# Patient Record
Sex: Female | Born: 1995 | Race: Black or African American | Hispanic: No | Marital: Single | Smoking: Never smoker
Health system: Southern US, Community
[De-identification: ages and names within clinical notes are randomized; demographics above are authoritative.]

## PROBLEM LIST (undated history)

## (undated) ENCOUNTER — Emergency Department (HOSPITAL_COMMUNITY): Admission: EM | Payer: No Typology Code available for payment source | Source: Home / Self Care

## (undated) ENCOUNTER — Inpatient Hospital Stay (HOSPITAL_COMMUNITY): Payer: Self-pay

## (undated) DIAGNOSIS — I509 Heart failure, unspecified: Secondary | ICD-10-CM

## (undated) DIAGNOSIS — Z8759 Personal history of other complications of pregnancy, childbirth and the puerperium: Secondary | ICD-10-CM

## (undated) DIAGNOSIS — J45909 Unspecified asthma, uncomplicated: Secondary | ICD-10-CM

## (undated) HISTORY — DX: Personal history of other complications of pregnancy, childbirth and the puerperium: Z87.59

---
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
## 2016-01-02 ENCOUNTER — Emergency Department (HOSPITAL_COMMUNITY): Payer: No Typology Code available for payment source

## 2016-01-02 ENCOUNTER — Emergency Department (HOSPITAL_COMMUNITY)
Admission: EM | Admit: 2016-01-02 | Discharge: 2016-01-02 | Disposition: A | Discharge status: Home / Self Care | Payer: No Typology Code available for payment source | Attending: Emergency Medicine | Admitting: Emergency Medicine

## 2016-01-02 ENCOUNTER — Encounter (HOSPITAL_COMMUNITY): Payer: Self-pay | Admitting: Emergency Medicine

## 2016-01-02 DIAGNOSIS — S29002A Unspecified injury of muscle and tendon of back wall of thorax, initial encounter: Secondary | ICD-10-CM | POA: Insufficient documentation

## 2016-01-02 DIAGNOSIS — Z79899 Other long term (current) drug therapy: Secondary | ICD-10-CM | POA: Diagnosis not present

## 2016-01-02 DIAGNOSIS — Y998 Other external cause status: Secondary | ICD-10-CM | POA: Diagnosis not present

## 2016-01-02 DIAGNOSIS — S199XXA Unspecified injury of neck, initial encounter: Secondary | ICD-10-CM | POA: Diagnosis present

## 2016-01-02 DIAGNOSIS — R110 Nausea: Secondary | ICD-10-CM | POA: Insufficient documentation

## 2016-01-02 DIAGNOSIS — Y92410 Unspecified street and highway as the place of occurrence of the external cause: Secondary | ICD-10-CM | POA: Diagnosis not present

## 2016-01-02 DIAGNOSIS — S161XXA Strain of muscle, fascia and tendon at neck level, initial encounter: Secondary | ICD-10-CM | POA: Diagnosis not present

## 2016-01-02 DIAGNOSIS — J45909 Unspecified asthma, uncomplicated: Secondary | ICD-10-CM | POA: Insufficient documentation

## 2016-01-02 DIAGNOSIS — Y9389 Activity, other specified: Secondary | ICD-10-CM | POA: Diagnosis not present

## 2016-01-02 MED ORDER — IBUPROFEN 400 MG PO TABS
400.0000 mg | ORAL_TABLET | Freq: Once | ORAL | Status: AC
  Administered 2016-01-02: 400 mg via ORAL
  Filled 2016-01-02: qty 1

## 2016-01-02 MED ORDER — ONDANSETRON 4 MG PO TBDP
4.0000 mg | ORAL_TABLET | Freq: Once | ORAL | Status: AC
  Administered 2016-01-02: 4 mg via ORAL
  Filled 2016-01-02: qty 1

## 2016-01-02 MED ORDER — IBUPROFEN 600 MG PO TABS: 600.0000 mg | ORAL_TABLET | Freq: Four times a day (QID) | ORAL | Status: DC | PRN

## 2016-01-02 MED ORDER — CYCLOBENZAPRINE HCL 10 MG PO TABS
5.0000 mg | ORAL_TABLET | Freq: Once | ORAL | Status: AC
  Administered 2016-01-02: 5 mg via ORAL
  Filled 2016-01-02: qty 1

## 2016-01-02 MED ORDER — CYCLOBENZAPRINE HCL 5 MG PO TABS: 5.0000 mg | ORAL_TABLET | Freq: Three times a day (TID) | ORAL | Status: DC | PRN

## 2016-01-02 NOTE — Discharge Instructions (Signed)
Take motrin for pain.   Take flexeril for muscle spasms.   Expect some neck and back pain for several days.   See your doctor  Return to ER if you have severe pain, worse spasms, chest pain, abdominal pain

## 2016-01-02 NOTE — ED Notes (Signed)
Pt verbalized understanding of discharge instructions and follow up care
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
## 2016-05-05 ENCOUNTER — Emergency Department (HOSPITAL_COMMUNITY)
Admission: EM | Admit: 2016-05-05 | Discharge: 2016-05-05 | Disposition: A | Discharge status: Home / Self Care | Payer: No Typology Code available for payment source | Attending: Emergency Medicine | Admitting: Emergency Medicine

## 2016-05-05 ENCOUNTER — Emergency Department (HOSPITAL_COMMUNITY): Payer: No Typology Code available for payment source

## 2016-05-05 ENCOUNTER — Encounter (HOSPITAL_COMMUNITY): Payer: Self-pay | Admitting: Emergency Medicine

## 2016-05-05 DIAGNOSIS — M25521 Pain in right elbow: Secondary | ICD-10-CM | POA: Insufficient documentation

## 2016-05-05 DIAGNOSIS — Z5321 Procedure and treatment not carried out due to patient leaving prior to being seen by health care provider: Secondary | ICD-10-CM | POA: Diagnosis not present

## 2016-05-05 NOTE — ED Notes (Signed)
Called multiple times in waiting room and subwaiting and no response.

## 2016-05-05 NOTE — ED Notes (Signed)
Called for patient again, no response 
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
## 2016-05-06 NOTE — ED Provider Notes (Signed)
Patient not in room x 2.  Patient was not located within the department.  Patient apparently eloped prior to being seen by a provider.    Kathryn Mornavid Kingsly Kloepfer, NP 05/06/16 [phone]    Gwyneth SproutWhitney Plunkett, MD 05/06/16 2137

## 2016-07-31 NOTE — L&D Delivery Note (Signed)
Delivery Note At 8:21 AM a viable female was delivered preciptously by RN (with Dr. Jackelyn KnifeMeisinger on stand-by) via Vaginal, Spontaneous (Presentation:LOA). CNM in room just after delivery of baby. Cord double clamped after 1 minute, cut by MGM. APGAR: 9, 9; weight pending.   Placenta status: spontaneous, intact via Tomasa BlaseSchultz.  Cord: 3VC with no complications.  Cord pH: n/a  Anesthesia: none Episiotomy: None Lacerations: None Suture Repair: n/a Est. Blood Loss (mL): 50  Mom to postpartum.  Baby to Couplet care / Skin to Skin.  Kathryn Moraolitta Shamar Engelmann, MSN, CNM 07/22/2017, 8:56 AM

## 2016-09-22 ENCOUNTER — Inpatient Hospital Stay (HOSPITAL_COMMUNITY): Payer: Medicaid Other | Admitting: Anesthesiology

## 2016-09-22 ENCOUNTER — Encounter (HOSPITAL_COMMUNITY): Payer: Self-pay

## 2016-09-22 ENCOUNTER — Inpatient Hospital Stay (HOSPITAL_COMMUNITY): Payer: Medicaid Other

## 2016-09-22 ENCOUNTER — Ambulatory Visit (HOSPITAL_COMMUNITY)
Admission: AD | Admit: 2016-09-22 | Discharge: 2016-09-22 | Disposition: A | Discharge status: Home / Self Care | Payer: Medicaid Other | Source: Ambulatory Visit | Attending: Obstetrics and Gynecology | Admitting: Obstetrics and Gynecology

## 2016-09-22 ENCOUNTER — Encounter (HOSPITAL_COMMUNITY)
Admission: AD | Disposition: A | Discharge status: Home / Self Care | Payer: Self-pay | Source: Ambulatory Visit | Attending: Obstetrics and Gynecology

## 2016-09-22 DIAGNOSIS — D649 Anemia, unspecified: Secondary | ICD-10-CM | POA: Diagnosis not present

## 2016-09-22 DIAGNOSIS — O209 Hemorrhage in early pregnancy, unspecified: Secondary | ICD-10-CM

## 2016-09-22 DIAGNOSIS — O26899 Other specified pregnancy related conditions, unspecified trimester: Secondary | ICD-10-CM

## 2016-09-22 DIAGNOSIS — R109 Unspecified abdominal pain: Secondary | ICD-10-CM

## 2016-09-22 DIAGNOSIS — O034 Incomplete spontaneous abortion without complication: Secondary | ICD-10-CM | POA: Insufficient documentation

## 2016-09-22 DIAGNOSIS — O039 Complete or unspecified spontaneous abortion without complication: Secondary | ICD-10-CM

## 2016-09-22 HISTORY — PX: DILATION AND EVACUATION: SHX1459

## 2016-09-22 LAB — URINALYSIS, ROUTINE W REFLEX MICROSCOPIC
BACTERIA UA: NONE SEEN
Bilirubin Urine: NEGATIVE
Glucose, UA: 50 mg/dL — AB
KETONES UR: NEGATIVE mg/dL
Nitrite: NEGATIVE
PROTEIN: 100 mg/dL — AB
Specific Gravity, Urine: 1.024 (ref 1.005–1.030)
pH: 6 (ref 5.0–8.0)

## 2016-09-22 LAB — CBC
HCT: 32.6 % — ABNORMAL LOW (ref 36.0–46.0)
HCT: 31.1 % — ABNORMAL LOW (ref 36.0–46.0)
HEMOGLOBIN: 10.8 g/dL — AB (ref 12.0–15.0)
Hemoglobin: 11.7 g/dL — ABNORMAL LOW (ref 12.0–15.0)
MCH: 29.6 pg (ref 26.0–34.0)
MCH: 29.1 pg (ref 26.0–34.0)
MCHC: 35.9 g/dL (ref 30.0–36.0)
MCHC: 34.7 g/dL (ref 30.0–36.0)
MCV: 82.5 fL (ref 78.0–100.0)
MCV: 83.8 fL (ref 78.0–100.0)
PLATELETS: 218 10*3/uL (ref 150–400)
Platelets: 216 K/uL (ref 150–400)
RBC: 3.95 MIL/uL (ref 3.87–5.11)
RBC: 3.71 MIL/uL — ABNORMAL LOW (ref 3.87–5.11)
RDW: 13 % (ref 11.5–15.5)
RDW: 13 % (ref 11.5–15.5)
WBC: 6.7 K/uL (ref 4.0–10.5)
WBC: 9.5 10*3/uL (ref 4.0–10.5)

## 2016-09-22 LAB — RAPID URINE DRUG SCREEN, HOSP PERFORMED
AMPHETAMINES: NOT DETECTED
BENZODIAZEPINES: NOT DETECTED
Barbiturates: NOT DETECTED
Cocaine: NOT DETECTED
Opiates: NOT DETECTED
TETRAHYDROCANNABINOL: NOT DETECTED

## 2016-09-22 LAB — COMPREHENSIVE METABOLIC PANEL
ALT: 10 U/L — ABNORMAL LOW (ref 14–54)
ANION GAP: 10 (ref 5–15)
AST: 16 U/L (ref 15–41)
Albumin: 3.8 g/dL (ref 3.5–5.0)
Alkaline Phosphatase: 51 U/L (ref 38–126)
BILIRUBIN TOTAL: 0.5 mg/dL (ref 0.3–1.2)
BUN: >5 mg/dL — ABNORMAL LOW (ref 6–20)
CHLORIDE: 104 mmol/L (ref 101–111)
CO2: 22 mmol/L (ref 22–32)
Calcium: 9 mg/dL (ref 8.9–10.3)
Creatinine, Ser: 0.64 mg/dL (ref 0.44–1.00)
GFR calc Af Amer: >60 mL/min (ref >60)
GFR calc non Af Amer: >60 mL/min (ref >60)
GLUCOSE: 132 mg/dL — AB (ref 65–99)
POTASSIUM: 3.3 mmol/L — AB (ref 3.5–5.1)
SODIUM: 136 mmol/L (ref 135–145)
TOTAL PROTEIN: 7.7 g/dL (ref 6.5–8.1)

## 2016-09-22 LAB — WET PREP, GENITAL
Clue Cells Wet Prep HPF POC: NONE SEEN
Sperm: NONE SEEN
Trich, Wet Prep: NONE SEEN
Yeast Wet Prep HPF POC: NONE SEEN

## 2016-09-22 LAB — KLEIHAUER-BETKE STAIN
# Vials RhIg: 1
FETAL CELLS %: 0 %
Quantitation Fetal Hemoglobin: 0 mL

## 2016-09-22 LAB — TSH: TSH: 1.652 u[IU]/mL (ref 0.350–4.500)

## 2016-09-22 LAB — POCT PREGNANCY, URINE: Preg Test, Ur: POSITIVE — AB

## 2016-09-22 LAB — ABO/RH: ABO/RH(D): O POS

## 2016-09-22 LAB — HCG, QUANTITATIVE, PREGNANCY: hCG, Beta Chain, Quant, S: 1184 m[IU]/mL — ABNORMAL HIGH (ref <5)

## 2016-09-22 SURGERY — DILATION AND EVACUATION, UTERUS
Anesthesia: Monitor Anesthesia Care | Site: Vagina

## 2016-09-22 MED ORDER — FERROUS GLUCONATE 324 (38 FE) MG PO TABS: 324.0000 mg | ORAL_TABLET | Freq: Two times a day (BID) | ORAL | 0 refills | Status: DC

## 2016-09-22 MED ORDER — LIDOCAINE HCL (CARDIAC) 20 MG/ML IV SOLN
INTRAVENOUS | Status: AC
  Filled 2016-09-22: qty 5

## 2016-09-22 MED ORDER — FAMOTIDINE IN NACL 20-0.9 MG/50ML-% IV SOLN
20.0000 mg | Freq: Once | INTRAVENOUS | Status: AC
  Administered 2016-09-22: 20 mg via INTRAVENOUS
  Filled 2016-09-22: qty 50

## 2016-09-22 MED ORDER — DEXAMETHASONE SODIUM PHOSPHATE 10 MG/ML IJ SOLN
INTRAMUSCULAR | Status: DC | PRN
  Administered 2016-09-22: 10 mg via INTRAVENOUS

## 2016-09-22 MED ORDER — PROPOFOL 10 MG/ML IV BOLUS
INTRAVENOUS | Status: AC
Start: 2016-09-22 — End: 2016-09-22
  Filled 2016-09-22: qty 20

## 2016-09-22 MED ORDER — HYDROMORPHONE HCL 1 MG/ML IJ SOLN
1.0000 mg | Freq: Once | INTRAMUSCULAR | Status: AC
Start: 2016-09-22 — End: 2016-09-22
  Administered 2016-09-22: 1 mg via INTRAMUSCULAR
  Filled 2016-09-22: qty 1

## 2016-09-22 MED ORDER — OXYCODONE-ACETAMINOPHEN 5-325 MG PO TABS: 1.0000 | ORAL_TABLET | Freq: Four times a day (QID) | ORAL | 0 refills | Status: DC | PRN

## 2016-09-22 MED ORDER — MIDAZOLAM HCL 2 MG/2ML IJ SOLN
INTRAMUSCULAR | Status: AC
  Filled 2016-09-22: qty 2

## 2016-09-22 MED ORDER — MIDAZOLAM HCL 5 MG/5ML IJ SOLN
INTRAMUSCULAR | Status: DC | PRN
  Administered 2016-09-22: 2 mg via INTRAVENOUS

## 2016-09-22 MED ORDER — PROMETHAZINE HCL 25 MG/ML IJ SOLN: 6.2500 mg | INTRAMUSCULAR | Status: DC | PRN

## 2016-09-22 MED ORDER — METHYLERGONOVINE MALEATE 0.2 MG/ML IJ SOLN
INTRAMUSCULAR | Status: AC
  Filled 2016-09-22: qty 1

## 2016-09-22 MED ORDER — DEXAMETHASONE SODIUM PHOSPHATE 10 MG/ML IJ SOLN
INTRAMUSCULAR | Status: AC
  Filled 2016-09-22: qty 1

## 2016-09-22 MED ORDER — HYDROMORPHONE HCL 1 MG/ML IJ SOLN
1.0000 mg | Freq: Once | INTRAMUSCULAR | Status: AC
  Administered 2016-09-22: 1 mg via INTRAVENOUS
  Filled 2016-09-22: qty 1

## 2016-09-22 MED ORDER — LACTATED RINGERS IV SOLN
INTRAVENOUS | Status: DC | PRN
  Administered 2016-09-22: 12:00:00 via INTRAVENOUS

## 2016-09-22 MED ORDER — LIDOCAINE 2% (20 MG/ML) 5 ML SYRINGE
INTRAMUSCULAR | Status: DC | PRN
  Administered 2016-09-22: 50 mg via INTRAVENOUS

## 2016-09-22 MED ORDER — 0.9 % SODIUM CHLORIDE (POUR BTL) OPTIME
TOPICAL | Status: DC | PRN
  Administered 2016-09-22: 1000 mL

## 2016-09-22 MED ORDER — FENTANYL CITRATE (PF) 100 MCG/2ML IJ SOLN: 25.0000 ug | INTRAMUSCULAR | Status: DC | PRN

## 2016-09-22 MED ORDER — SODIUM CHLORIDE 0.9 % IV SOLN
INTRAVENOUS | Status: DC | PRN
  Administered 2016-09-22: 12:00:00 via INTRAVENOUS

## 2016-09-22 MED ORDER — IBUPROFEN 600 MG PO TABS: 600.0000 mg | ORAL_TABLET | Freq: Four times a day (QID) | ORAL | 0 refills | Status: DC | PRN

## 2016-09-22 MED ORDER — ROCURONIUM BROMIDE 100 MG/10ML IV SOLN
INTRAVENOUS | Status: AC
  Filled 2016-09-22: qty 1

## 2016-09-22 MED ORDER — METHYLERGONOVINE MALEATE 0.2 MG/ML IJ SOLN
0.2000 mg | Freq: Once | INTRAMUSCULAR | Status: AC
  Administered 2016-09-22: 0.2 mg via INTRAMUSCULAR

## 2016-09-22 MED ORDER — POTASSIUM CHLORIDE IN NACL 40-0.9 MEQ/L-% IV SOLN
INTRAVENOUS | Status: DC
  Filled 2016-09-22: qty 1000

## 2016-09-22 MED ORDER — FENTANYL CITRATE (PF) 100 MCG/2ML IJ SOLN
INTRAMUSCULAR | Status: DC | PRN
  Administered 2016-09-22 (×2): 50 ug via INTRAVENOUS

## 2016-09-22 MED ORDER — ONDANSETRON HCL 4 MG/2ML IJ SOLN
INTRAMUSCULAR | Status: AC
  Filled 2016-09-22: qty 2

## 2016-09-22 MED ORDER — DOCUSATE SODIUM 100 MG PO CAPS: 100.0000 mg | ORAL_CAPSULE | Freq: Two times a day (BID) | ORAL | 0 refills | Status: DC

## 2016-09-22 MED ORDER — FENTANYL CITRATE (PF) 100 MCG/2ML IJ SOLN
INTRAMUSCULAR | Status: AC
  Filled 2016-09-22: qty 2

## 2016-09-22 MED ORDER — ONDANSETRON HCL 4 MG/2ML IJ SOLN
INTRAMUSCULAR | Status: DC | PRN
  Administered 2016-09-22: 4 mg via INTRAVENOUS

## 2016-09-22 MED ORDER — SOD CITRATE-CITRIC ACID 500-334 MG/5ML PO SOLN
30.0000 mL | Freq: Once | ORAL | Status: AC
  Administered 2016-09-22: 30 mL via ORAL
  Filled 2016-09-22: qty 15

## 2016-09-22 MED ORDER — DOXYCYCLINE HYCLATE 100 MG IV SOLR
100.0000 mg | Freq: Once | INTRAVENOUS | Status: AC
  Administered 2016-09-22: 100 mg via INTRAVENOUS
  Filled 2016-09-22: qty 100

## 2016-09-22 MED ORDER — PROPOFOL 10 MG/ML IV BOLUS
INTRAVENOUS | Status: DC | PRN
  Administered 2016-09-22: 30 mg via INTRAVENOUS
  Administered 2016-09-22: 20 mg via INTRAVENOUS
  Administered 2016-09-22: 30 mg via INTRAVENOUS
  Administered 2016-09-22 (×3): 20 mg via INTRAVENOUS
  Administered 2016-09-22: 30 mg via INTRAVENOUS
  Administered 2016-09-22: 20 mg via INTRAVENOUS

## 2016-09-22 SURGICAL SUPPLY — 19 items
CATH ROBINSON RED A/P 16FR (CATHETERS) ×3 IMPLANT
CLOTH BEACON ORANGE TIMEOUT ST (SAFETY) ×3 IMPLANT
DECANTER SPIKE VIAL GLASS SM (MISCELLANEOUS) ×3 IMPLANT
GLOVE BIO SURGEON STRL SZ7 (GLOVE) ×3 IMPLANT
GLOVE BIOGEL PI IND STRL 7.0 (GLOVE) ×1 IMPLANT
GLOVE BIOGEL PI INDICATOR 7.0 (GLOVE) ×2
GOWN STRL REUS W/TWL LRG LVL3 (GOWN DISPOSABLE) ×6 IMPLANT
KIT BERKELEY 1ST TRIMESTER 3/8 (MISCELLANEOUS) ×3 IMPLANT
NS IRRIG 1000ML POUR BTL (IV SOLUTION) ×3 IMPLANT
PACK VAGINAL MINOR WOMEN LF (CUSTOM PROCEDURE TRAY) ×3 IMPLANT
PAD OB MATERNITY 4.3X12.25 (PERSONAL CARE ITEMS) ×3 IMPLANT
PAD PREP 24X48 CUFFED NSTRL (MISCELLANEOUS) ×3 IMPLANT
SET BERKELEY SUCTION TUBING (SUCTIONS) ×3 IMPLANT
TOWEL OR 17X24 6PK STRL BLUE (TOWEL DISPOSABLE) ×6 IMPLANT
TOWEL OR 17X26 4PK STRL BLUE (TOWEL DISPOSABLE) ×3 IMPLANT
VACURETTE 10 RIGID CVD (CANNULA) ×3 IMPLANT
VACURETTE 7MM CVD STRL WRAP (CANNULA) IMPLANT
VACURETTE 8 RIGID CVD (CANNULA) ×1 IMPLANT
VACURETTE 9 RIGID CVD (CANNULA) IMPLANT

## 2016-09-22 NOTE — MAU Note (Signed)
Pt states that she started her period on Sunday-last night began having sharp abdominal pain-rates 10/10. Took 600mg  ibuprofen at 0400-has not helped. States this pain is worse than her normal periods.

## 2016-09-22 NOTE — Anesthesia Preprocedure Evaluation (Addendum)
Anesthesia Evaluation  Patient identified by MRN, date of birth, ID band Patient awake    Reviewed: Allergy & Precautions, NPO status , Patient's Chart, lab work & pertinent test results  Airway Mallampati: II  TM Distance: >3 FB Neck ROM: Full    Dental  (+) Teeth Intact, Dental Advisory Given   Pulmonary asthma ,    Pulmonary exam normal breath sounds clear to auscultation       Cardiovascular Exercise Tolerance: Good negative cardio ROS Normal cardiovascular exam Rhythm:Regular Rate:Normal     Neuro/Psych negative neurological ROS  negative psych ROS   GI/Hepatic negative GI ROS, Neg liver ROS,   Endo/Other  negative endocrine ROS  Renal/GU negative Renal ROS     Musculoskeletal negative musculoskeletal ROS (+)   Abdominal   Peds  Hematology  (+) Blood dyscrasia, anemia ,   Anesthesia Other Findings Day of surgery medications reviewed with the patient.  Reproductive/Obstetrics (+) Pregnancy Fetal demise                             Anesthesia Physical Anesthesia Plan  ASA: II  Anesthesia Plan: MAC   Post-op Pain Management:    Induction: Intravenous  Airway Management Planned: Simple Face Mask  Additional Equipment:   Intra-op Plan:   Post-operative Plan:   Informed Consent: I have reviewed the patients History and Physical, chart, labs and discussed the procedure including the risks, benefits and alternatives for the proposed anesthesia with the patient or authorized representative who has indicated his/her understanding and acceptance.   Dental advisory given  Plan Discussed with: CRNA  Anesthesia Plan Comments: (Discussed risks/benefits/alternatives to MAC sedation including need for ventilatory support, hypotension, need for conversion to general anesthesia.  All patient questions answered.  Patient/guardian wishes to proceed.)       Anesthesia Quick  Evaluation

## 2016-09-22 NOTE — MAU Provider Note (Signed)
History     CSN: [id number]  Arrival date and time: 09/22/16 [id number]   First Provider Initiated Contact with Patient 09/22/16 [phone]      Chief Complaint  Patient presents with  . Vaginal Bleeding   Vaginal Bleeding  The patient's primary symptoms include pelvic pain and vaginal bleeding. The patient's pertinent negatives include no vaginal discharge. This is a new problem. Episode onset: 09/17/16  The problem occurs intermittently. The problem has been unchanged. Pain severity now: 9/10  The problem affects both sides. She is pregnant. Pertinent negatives include no chills, constipation, diarrhea, dysuria, fever, frequency, nausea, urgency or vomiting. The vaginal discharge was bloody. The vaginal bleeding is typical of menses. She has been passing clots (about the size of a pea. ). She has not been passing tissue. Nothing aggravates the symptoms. She has tried NSAIDs for the symptoms. The treatment provided no relief. Sexual activity: last intercourse about on month ago. She uses nothing for contraception. Her menstrual history has been regular (10/somtime/2017 ).   Past Medical History:  Diagnosis Date  . Asthma   . Medical history non-contributory     Past Surgical History:  Procedure Laterality Date  . NO PAST SURGERIES      History reviewed. No pertinent family history.  Social History  Substance Use Topics  . Smoking status: Never Smoker  . Smokeless tobacco: Never Used  . Alcohol use No    Allergies: No Known Allergies  Prescriptions Prior to Admission  Medication Sig Dispense Refill Last Dose  . cyclobenzaprine (FLEXERIL) 5 MG tablet Take 1 tablet (5 mg total) by mouth 3 (three) times daily as needed for muscle spasms. 10 tablet 0   . famotidine (PEPCID) 20 MG tablet Take 1 tablet (20 mg total) by mouth 2 (two) times daily. 14 tablet 0 Past Month at Unknown time  . ibuprofen (ADVIL,MOTRIN) 600 MG tablet Take 1 tablet (600 mg total) by mouth every 6 (six) hours as needed.  30 tablet 0   . ondansetron (ZOFRAN ODT) 4 MG disintegrating tablet Take 1 tablet (4 mg total) by mouth every 8 (eight) hours as needed for nausea or vomiting. 9 tablet 0 Past Month at Unknown time    Review of Systems  Constitutional: Negative for chills and fever.  Gastrointestinal: Negative for constipation, diarrhea, nausea and vomiting.  Genitourinary: Positive for pelvic pain and vaginal bleeding. Negative for dysuria, frequency, urgency and vaginal discharge.   Physical Exam   Blood pressure 142/79, pulse (!) 134, temperature 99.4 F (37.4 C), temperature source Oral, resp. rate 20, height 4\' 11"  (1.499 m), weight 133 lb (60.3 kg), last menstrual period 09/17/2016, SpO2 100 %.  Physical Exam  Nursing note and vitals reviewed. Constitutional: She is oriented to person, place, and time. She appears well-developed and well-nourished. No distress.  HENT:  Head: Normocephalic.  Cardiovascular: Normal rate.   Respiratory: Effort normal.  GI: Soft. There is no tenderness. There is no rebound.  Genitourinary:  Genitourinary Comments:  External: no lesion Vagina: small amount of blood seen  Cervix: pink, smooth, closed/thick Uterus: uterus about 13-14 week size   Neurological: She is alert and oriented to person, place, and time.  Skin: Skin is warm and dry.  Psychiatric: She has a normal mood and affect.   Results for orders placed or performed during the hospital encounter of 09/22/16 (from the past 24 hour(s))  Urinalysis, Routine w reflex microscopic     Status: Abnormal   Collection Time: 09/22/16  5:53 AM  Result Value Ref Range   Color, Urine YELLOW YELLOW   APPearance CLOUDY (A) CLEAR   Specific Gravity, Urine 1.024 1.005 - 1.030   pH 6.0 5.0 - 8.0   Glucose, UA 50 (A) NEGATIVE mg/dL   Hgb urine dipstick LARGE (A) NEGATIVE   Bilirubin Urine NEGATIVE NEGATIVE   Ketones, ur NEGATIVE NEGATIVE mg/dL   Protein, ur 161 (A) NEGATIVE mg/dL   Nitrite NEGATIVE NEGATIVE    Leukocytes, UA MODERATE (A) NEGATIVE   RBC / HPF TOO NUMEROUS TO COUNT 0 - 5 RBC/hpf   WBC, UA TOO NUMEROUS TO COUNT 0 - 5 WBC/hpf   Bacteria, UA NONE SEEN NONE SEEN   Squamous Epithelial / LPF 0-5 (A) NONE SEEN   WBC Clumps PRESENT    Mucous PRESENT   Pregnancy, urine POC     Status: Abnormal   Collection Time: 09/22/16  6:06 AM  Result Value Ref Range   Preg Test, Ur POSITIVE (A) NEGATIVE  Wet prep, genital     Status: Abnormal   Collection Time: 09/22/16  6:36 AM  Result Value Ref Range   Yeast Wet Prep HPF POC NONE SEEN NONE SEEN   Trich, Wet Prep NONE SEEN NONE SEEN   Clue Cells Wet Prep HPF POC NONE SEEN NONE SEEN   WBC, Wet Prep HPF POC FEW (A) NONE SEEN   Sperm NONE SEEN   CBC     Status: Abnormal   Collection Time: 09/22/16  6:41 AM  Result Value Ref Range   WBC 6.7 4.0 - 10.5 K/uL   RBC 3.95 3.87 - 5.11 MIL/uL   Hemoglobin 11.7 (L) 12.0 - 15.0 g/dL   HCT 09.6 (L) 04.5 - 40.9 %   MCV 82.5 78.0 - 100.0 fL   MCH 29.6 26.0 - 34.0 pg   MCHC 35.9 30.0 - 36.0 g/dL   RDW 81.1 91.4 - 78.2 %   Platelets 216 150 - 400 K/uL  ABO/Rh     Status: None (Preliminary result)   Collection Time: 09/22/16  6:41 AM  Result Value Ref Range   ABO/RH(D) O POS   hCG, quantitative, pregnancy     Status: Abnormal   Collection Time: 09/22/16  6:41 AM  Result Value Ref Range   hCG, Beta Chain, Quant, S 1,184 (H) <5 mIU/mL  Comprehensive metabolic panel     Status: Abnormal (Preliminary result)   Collection Time: 09/22/16  6:41 AM  Result Value Ref Range   Sodium 136 135 - 145 mmol/L   Potassium 3.3 (L) 3.5 - 5.1 mmol/L   Chloride 104 101 - 111 mmol/L   CO2 22 22 - 32 mmol/L   Glucose, Bld 132 (H) 65 - 99 mg/dL   BUN PENDING 6 - 20 mg/dL   Creatinine, Ser 9.56 0.44 - 1.00 mg/dL   Calcium 9.0 8.9 - 21.3 mg/dL   Total Protein 7.7 6.5 - 8.1 g/dL   Albumin 3.8 3.5 - 5.0 g/dL   AST 16 15 - 41 U/L   ALT 10 (L) 14 - 54 U/L   Alkaline Phosphatase 51 38 - 126 U/L   Total Bilirubin 0.5 0.3 -  1.2 mg/dL   GFR calc non Af Amer >60 >60 mL/min   GFR calc Af Amer >60 >60 mL/min   Anion gap 10 5 - 15   US Ob Comp Less 14 Wks  Result Date: 09/22/2016 CLINICAL DATA:  Six day history of abdominal/pelvic pain EXAM: OBSTETRIC <14 WK [place] AND TRANSVAGINAL OB US  TECHNIQUE: Both transabdominal and transvaginal ultrasound examinations were performed for complete evaluation of the gestation as well as the maternal uterus, adnexal regions, and pelvic cul-de-sac. Transvaginal technique was performed to assess early pregnancy. COMPARISON:  None. FINDINGS: Intrauterine gestational sac: Visualized Yolk sac:  Not visualized Embryo:  Visualized Cardiac Activity: Not visualized MSD: 36 mm Note that fetal cranium cannot be visualized. Subchorionic hemorrhage:  None visualized. Maternal uterus/adnexae: Cervical os closed. No extrauterine pelvic or adnexal mass. No free pelvic fluid. IMPRESSION: Findings meet definitive criteria for failed pregnancy. This follows SRU consensus guidelines: Diagnostic Criteria for Nonviable Pregnancy Early in the First Trimester. Macy Mis J Med [phone]. Note that fetal cranial tissue cannot be discerned on this study. Electronically Signed   By: Bretta Bang III M.D.   On: 09/22/2016 07:36   US Ob Transvaginal  Result Date: 09/22/2016 CLINICAL DATA:  Six day history of abdominal/pelvic pain EXAM: OBSTETRIC <14 WK [place] AND TRANSVAGINAL OB US TECHNIQUE: Both transabdominal and transvaginal ultrasound examinations were performed for complete evaluation of the gestation as well as the maternal uterus, adnexal regions, and pelvic cul-de-sac. Transvaginal technique was performed to assess early pregnancy. COMPARISON:  None. FINDINGS: Intrauterine gestational sac: Visualized Yolk sac:  Not visualized Embryo:  Visualized Cardiac Activity: Not visualized MSD: 36 mm Note that fetal cranium cannot be visualized. Subchorionic hemorrhage:  None visualized. Maternal uterus/adnexae: Cervical os  closed. No extrauterine pelvic or adnexal mass. No free pelvic fluid. IMPRESSION: Findings meet definitive criteria for failed pregnancy. This follows SRU consensus guidelines: Diagnostic Criteria for Nonviable Pregnancy Early in the First Trimester. Macy Mis J Med [phone]. Note that fetal cranial tissue cannot be discerned on this study. Electronically Signed   By: Bretta Bang III M.D.   On: 09/22/2016 07:36    MAU Course  Procedures  MDM 0744: D/W Dr. Alysia Penna, they are in sign out right now, but Dr. Vergie Living will come evaluate the patient for possible D&C. Patient has had 1mg  Dilaudid for pain. She reports that her pain is 6/10 now.   0800 Care turned over to Dr. Vergie Living.  Tawnya Crook  7:59 AM 09/22/16   Assessment and Plan

## 2016-09-22 NOTE — Op Note (Addendum)
Operative Note   09/22/2016  PRE-OP DIAGNOSIS: ?retained products of conception after spontaneous abortion   POST-OP DIAGNOSIS: retained POCs  SURGEON: Surgeon(s) and Role:    * Marion Bingharlie Gorgeous Newlun, MD - Primary  ASSISTANT: None  PROCEDURE:  Suction dilation and curettage  ANESTHESIA: Monitor Anesthesia Care  ESTIMATED BLOOD LOSS: 25mL  DRAINS: I/O catheter 80mL UOP  TOTAL IV FLUIDS: per anesthesia note  SPECIMENS: products of conception to pathology  VTE PROPHYLAXIS: SCDs to the bilateral lower extremities  ANTIBIOTICS: Doxycycline 100mg  IV x 1 pre op  COMPLICATIONS: none  DISPOSITION: PACU - hemodynamically stable.  CONDITION: stable  BLOOD TYPE: O POS. Rhogam given:not applicable  FINDINGS: Normal appearing products of conception was seen on suction at the fundus. gritty texture in all four quadrants at the end of the case  PROCEDURE IN DETAIL:  After informed consent was obtained, the patient was taken to the operating room where anesthesia was obtained without difficulty. The patient was positioned in the dorsal lithotomy position in [place] stirrups. The patient was examined under anesthesia, with the above noted findings.  The bi-valved speculum was placed inside the patient's vagina, and the the anterior lip of the cervix was seen and grasped with the tenaculum.  The cervix easily passed a #10 suction cannula.  The suction was then calibrated to 60mmHg and connected to the cannula, which was then introduced with the above noted findings. A gentle curettage was done at the end and yield no products of conception.   The suction was then done one more time to remove any remaining curettage material.  Excellent hemostasis was noted, and all instruments were removed, with excellent hemostasis noted throughout.  She was then taken out of dorsal lithotomy. The patient tolerated the procedure well.  Sponge, lap and instrument counts were correct x2.  The patient was taken to  recovery room in excellent condition.  Methergine 0.2mg  IM x 1 was given at the end of the case for bleeding prophylaxis.   Cornelia Copaharlie Zana Biancardi, Jr MD Attending [facility](Faculty Practice)

## 2016-09-22 NOTE — Anesthesia Postprocedure Evaluation (Signed)
Anesthesia Post Note  Patient: Kathryn Silva  Procedure(s) Performed: Procedure(s) (LRB): SUCTION DILATATION AND EVACUATION (N/A)  Patient location during evaluation: PACU Anesthesia Type: MAC Level of consciousness: awake and alert Pain management: pain level controlled Vital Signs Assessment: post-procedure vital signs reviewed and stable Respiratory status: spontaneous breathing, nonlabored ventilation, respiratory function stable and patient connected to nasal cannula oxygen Cardiovascular status: blood pressure returned to baseline and stable Postop Assessment: no signs of nausea or vomiting Anesthetic complications: no        Last Vitals:  Vitals:   09/22/16 1345 09/22/16 1437  BP: 113/77 119/65  Pulse: (!) 117 (!) 109  Resp: 19 20  Temp:  36.9 C    Last Pain:  Vitals:   09/22/16 1110  TempSrc:   PainSc: 3    Pain Goal:                 Catalina Gravel

## 2016-09-22 NOTE — H&P (Addendum)
See MAU Note. Agree with note. LMP sometime in October. No birth control use.   No PNC and VB (slowed) and some abdominal pain. U/s shows CRL of 11-12wk but no cardiac motion, normal AF, singleton pregnancy. D/w pt that given gest. Age, recommend suction d&c which she is amenable to. Last ate at 0200-0230. AF VS normal and stable. D/w OR and can do later this AM. Will use u/s guidance given GA. Pt consented and uterine perforation risk d/w her  NAD No mrgs, normal s1 and s2 CTAB Abd: nttp.   O POS CBC    Component Value Date/Time   WBC 6.7 09/22/2016 0641   RBC 3.95 09/22/2016 0641   HGB 11.7 (L) 09/22/2016 0641   HCT 32.6 (L) 09/22/2016 0641   PLT 216 09/22/2016 0641   MCV 82.5 09/22/2016 0641   MCH 29.6 09/22/2016 0641   MCHC 35.9 09/22/2016 0641   RDW 13.0 09/22/2016 0641   Kb, tsh, a1c and UDS ordered. Will d/w pt for further work up at outpatient visit. IVF with K.   [name], Jr MD Attending [facility] ([facility]ulty [facility]) 09/22/2016 Time: [phone]

## 2016-09-22 NOTE — Discharge Instructions (Addendum)
General Anesthesia, Adult, Care After These instructions provide you with information about caring for yourself after your procedure. Your health care provider may also give you more specific instructions. Your treatment has been planned according to current medical practices, but problems sometimes occur. Call your health care provider if you have any problems or questions after your procedure. What can I expect after the procedure? After the procedure, it is common to have:  Vomiting.  A sore throat.  Mental slowness. It is common to feel:  Nauseous.  Cold or shivery.  Sleepy.  Tired.  Sore or achy, even in parts of your body where you did not have surgery. Follow these instructions at home: For at least 24 hours after the procedure:  Do not:  Participate in activities where you could fall or become injured.  Drive.  Use heavy machinery.  Drink alcohol.  Take sleeping pills or medicines that cause drowsiness.  Make important decisions or sign legal documents.  Take care of children on your own.  Rest. Eating and drinking  If you vomit, drink water, juice, or soup when you can drink without vomiting.  Drink enough fluid to keep your urine clear or pale yellow.  Make sure you have little or no nausea before eating solid foods.  Follow the diet recommended by your health care provider. General instructions  Have a responsible adult stay with you until you are awake and alert.  Return to your normal activities as told by your health care provider. Ask your health care provider what activities are safe for you.  Take over-the-counter and prescription medicines only as told by your health care provider.  If you smoke, do not smoke without supervision.  Keep all follow-up visits as told by your health care provider. This is important. Contact a health care provider if:  You continue to have nausea or vomiting at home, and medicines are not helpful.  You  cannot drink fluids or start eating again.  You cannot urinate after 8-12 hours.  You develop a skin rash.  You have fever.  You have increasing redness at the site of your procedure. Get help right away if:  You have difficulty breathing.  You have chest pain.  You have unexpected bleeding.  You feel that you are having a life-threatening or urgent problem. This information is not intended to replace advice given to you by your health care provider. Make sure you discuss any questions you have with your health care provider. Document Released: 10/23/2000 Document Revised: 12/20/2015 Document Reviewed: 07/01/2015 Elsevier Interactive Patient Education  2017 [employer].   We will discuss your surgery once again in detail at your post-op visit in two to four weeks. If you havent already done so, please call to make your appointment as soon as possible.  Dilation and Curettage or Vacuum Curettage, Care After These instructions give you information on caring for yourself after your procedure. Your doctor may also give you more specific instructions. Call your doctor if you have any problems or questions after your procedure. HOME CARE  Do not drive for 24 hours.  Wait 1 week before doing any activities that wear you out.  Do not stand for a long time.  Limit stair climbing to once or twice a day.  Rest often.  Continue with your usual diet.  Drink enough fluids to keep your pee (urine) clear or pale yellow.  If you have a hard time pooping (constipation), you may:  Take a medicine to  help you go poop (laxative) as told by your doctor.  Eat more fruit and bran.  Drink more fluids.  Take showers, not baths, for as long as told by your doctor.  Do not swim or use a hot tub until your doctor says it is okay.  Have someone with you for 1day after the procedure.  Do not douche, use tampons, or have sex (intercourse) until seen by your doctor  Only take medicines  as told by your doctor. Do not take aspirin. It can cause bleeding.  Keep all doctor visits. GET HELP IF:  You have cramps or pain not helped by medicine.  You have new pain in the belly (abdomen).  You have a bad smelling fluid coming from your vagina.  You have a rash.  You have problems with any medicine. GET HELP RIGHT AWAY IF:   You start to bleed more than a regular period.  You have a fever.  You have chest pain.  You have trouble breathing.  You feel dizzy or feel like passing out (fainting).  You pass out.  You have pain in the tops of your shoulders.  You have vaginal bleeding with or without clumps of blood (blood clots). MAKE SURE YOU:  Understand these instructions.  Will watch your condition.  Will get help right away if you are not doing well or get worse. Document Released: 04/25/2008 Document Revised: 07/22/2013 Document Reviewed: 02/13/2013 [facility] Patient Information 2015 [place]. This information is not intended to replace advice given to you by your health care provider. Make sure you discuss any questions you have with your health care provider.

## 2016-09-22 NOTE — Progress Notes (Signed)
MAU Note  CTSP for passing POCs On exam, BBOW (intact) at the introitus and easily teased out of the vagina. BOW intact and fetus inside. Sterile speculum exam done and POCs removed from the vagina. Ringed forceps to used to remove likely remaining POCs from the cervix/lower uterine segment. Minimal oozing from the cervix/cervical canal. Bedside TA u/s shows likely some blood products but possible POCs in the lower uterine segment. Still recommend suction d&c in the OR but no need for u/s and likely can be done under light sedation. D/w anesthesia.  Pt would like pregnancy sent to pathology for exam. Patient stable and can proceed to OR when OR is ready, as already scheduled.  Kathryn Silva, Jr MD Attending [facility] 09/22/2016 Time: 1140am

## 2016-09-22 NOTE — Transfer of Care (Signed)
Immediate Anesthesia Transfer of Care Note  Patient: Kathryn Silva  Procedure(s) Performed: Procedure(s): SUCTION DILATATION AND EVACUATION (N/A)  Patient Location: PACU  Anesthesia Type:MAC  Level of Consciousness:  sedated, patient cooperative and responds to stimulation  Airway & Oxygen Therapy:Patient Spontanous Breathing and Patient connected to face mask oxgen  Post-op Assessment:  Report given to PACU RN and Post -op Vital signs reviewed and stable  Post vital signs:  Reviewed and stable  Last Vitals:  Vitals:   09/22/16 1052 09/22/16 1110  BP: 110/72 113/66  Pulse: 110 115  Resp: 16 18  Temp:      Complications: No apparent anesthesia complications

## 2016-09-23 LAB — HEMOGLOBIN A1C
Hgb A1c MFr Bld: 5.2 % (ref 4.8–5.6)
Mean Plasma Glucose: 103 mg/dL

## 2016-09-23 LAB — RPR: RPR Ser Ql: NONREACTIVE

## 2016-09-23 LAB — HIV ANTIBODY (ROUTINE TESTING W REFLEX): HIV Screen 4th Generation wRfx: NONREACTIVE

## 2016-09-25 ENCOUNTER — Encounter (HOSPITAL_COMMUNITY): Payer: Self-pay | Admitting: Obstetrics and Gynecology

## 2016-09-25 LAB — GC/CHLAMYDIA PROBE AMP (~~LOC~~) NOT AT ARMC
CHLAMYDIA, DNA PROBE: NEGATIVE
Neisseria Gonorrhea: NEGATIVE

## 2016-09-26 ENCOUNTER — Encounter: Payer: Self-pay | Admitting: Obstetrics and Gynecology

## 2016-09-26 DIAGNOSIS — Z8759 Personal history of other complications of pregnancy, childbirth and the puerperium: Secondary | ICD-10-CM

## 2016-09-26 DIAGNOSIS — O09299 Supervision of pregnancy with other poor reproductive or obstetric history, unspecified trimester: Secondary | ICD-10-CM | POA: Insufficient documentation

## 2016-09-26 HISTORY — DX: Personal history of other complications of pregnancy, childbirth and the puerperium: Z87.59

## 2016-10-12 ENCOUNTER — Ambulatory Visit (INDEPENDENT_AMBULATORY_CARE_PROVIDER_SITE_OTHER): Payer: Self-pay | Admitting: Obstetrics and Gynecology

## 2016-10-12 ENCOUNTER — Encounter: Payer: Self-pay | Admitting: Obstetrics and Gynecology

## 2016-10-12 VITALS — BP 112/78 | HR 96 | Temp 98.6°F | Ht 59.0 in | Wt 133.1 lb

## 2016-10-12 DIAGNOSIS — Z5189 Encounter for other specified aftercare: Principal | ICD-10-CM

## 2016-10-12 DIAGNOSIS — O039 Complete or unspecified spontaneous abortion without complication: Secondary | ICD-10-CM

## 2016-10-12 MED ORDER — MEDROXYPROGESTERONE ACETATE 150 MG/ML IM SUSP: 150.0000 mg | INTRAMUSCULAR | 3 refills | Status: DC

## 2016-10-12 NOTE — Progress Notes (Signed)
PHq-9=12, patient informed of Asher MuirJamie and services she can provide to help her with her depression. Patient declines to see Asher MuirJamie at this time, denies SI.

## 2016-10-12 NOTE — Progress Notes (Signed)
Obstetrics and Gynecology Follow Up Evaluation  Appointment Date: 10/12/2016  [facility]  Primary Care Provider: No PCP Per Patient  Chief Complaint:  Chief Complaint  Patient presents with  . Follow-up    History of Present Illness: Kathryn Silva is a 21 y.o. African-American G1P0010, seen for the above chief complaint. She is here for f/u s/p 2/23 suction d&c for rPOCs. She was approximately 11-12wks and presented to the MAU for VB, IUFD and noted to be in the process of having a miscarriage.  No period yet but no pain or bleeding since then.   Review of Systems:as noted in the History of Present Illness.  Past Medical History:  Past Medical History:  Diagnosis Date  . Asthma   . History of intrauterine death 09/26/2016   08/2016: 11-12wks. No PNC. Came into OB triage VB, pain and BBOW. See surg path for fetus (+abnormalities). Needed D&C for rPOCs. Negative KB, TSH, rpr, hiv, cbc, GC/CT, wet prep, U/a, UDS.   Marland Kitchen. Medical history non-contributory     Past Surgical History:  Past Surgical History:  Procedure Laterality Date  . DILATION AND EVACUATION N/A 09/22/2016   Procedure: SUCTION DILATATION AND EVACUATION;  Surgeon: [place] Bingharlie Faizon Capozzi, MD;  Location: [facility];  Service: Gynecology;  Laterality: N/A;  . NO PAST SURGERIES      Past Obstetrical History:  OB History  Gravida Para Term Preterm AB Living  1            SAB TAB Ectopic Multiple Live Births               # Outcome Date GA Lbr Len/2nd Weight Sex Delivery Anes PTL Lv  1 Current               Past Gynecological History: As per HPI. [facility].  Social History:  Social History   Social History  . Marital status: Married    Spouse name: N/A  . Number of children: N/A  . Years of education: N/A   Occupational History  . Not on file.   Social History Main Topics  . Smoking status: Never Smoker  . Smokeless tobacco: Never Used  . Alcohol use No   . Drug use: No  . Sexual activity: No   Other Topics Concern  . Not on file   Social History Narrative  . No narrative on file    Family History: No family history on file. She denies any female cancers, bleeding or blood clotting disorders.   Medications None  Allergies Patient has no known allergies.   Physical Exam:  BP 112/78   Pulse 96   Temp 98.6 F (37 C)   Ht 4\' 11"  (1.499 m)   Wt 133 lb 1.6 oz (60.4 kg)    BMI 26.88 kg/m  Body mass index is 26.88 kg/m. General appearance: Well nourished, well developed female in no acute distress.   Laboratory: path reveiwed  Radiology: none  Assessment: pt doing well  Plan:  1. Follow-up visit after miscarriage D/w her that based on the gross pathology (no cytogenetics sent) that the fetus had abnormalities and concerning for chromosomal issues. I told her that although there is no way to know now that if there was some that this is very likely the reason she had an IUFD and then subsequent miscarriage. FOB is 21 y/o and no other children and is healthy and he also has no fhx of  genetic issues that she is aware of. I told her that if she wants to see mfm for consideration for cytogenetics on both to let us know but that I wouldn't necessarily recommend or not recommend it based on her history; pt to consider and let us know. Pt told to expect a period in the next few weeks if she decides not to do anything for [facility].  Pt would like to do depo for [facility]; this was sent in and pt told to call us for inj visit after picking this up.   RTC PRN  Cornelia Copa MD Attending Center for [facility])

## 2017-02-18 ENCOUNTER — Inpatient Hospital Stay (HOSPITAL_COMMUNITY)
Admission: AD | Admit: 2017-02-18 | Discharge: 2017-02-18 | Disposition: A | Discharge status: Home / Self Care | Payer: Self-pay | Source: Ambulatory Visit | Attending: Obstetrics & Gynecology | Admitting: Obstetrics & Gynecology

## 2017-02-18 ENCOUNTER — Encounter (HOSPITAL_COMMUNITY): Payer: Self-pay | Admitting: *Deleted

## 2017-02-18 ENCOUNTER — Inpatient Hospital Stay (HOSPITAL_COMMUNITY): Payer: Self-pay

## 2017-02-18 DIAGNOSIS — Z3687 Encounter for antenatal screening for uncertain dates: Secondary | ICD-10-CM | POA: Insufficient documentation

## 2017-02-18 DIAGNOSIS — R42 Dizziness and giddiness: Secondary | ICD-10-CM | POA: Insufficient documentation

## 2017-02-18 DIAGNOSIS — O352XX0 Maternal care for (suspected) hereditary disease in fetus, not applicable or unspecified: Secondary | ICD-10-CM | POA: Insufficient documentation

## 2017-02-18 DIAGNOSIS — Z789 Other specified health status: Secondary | ICD-10-CM

## 2017-02-18 DIAGNOSIS — O210 Mild hyperemesis gravidarum: Secondary | ICD-10-CM

## 2017-02-18 DIAGNOSIS — Z3A17 17 weeks gestation of pregnancy: Secondary | ICD-10-CM | POA: Insufficient documentation

## 2017-02-18 DIAGNOSIS — O26892 Other specified pregnancy related conditions, second trimester: Secondary | ICD-10-CM | POA: Insufficient documentation

## 2017-02-18 LAB — URINALYSIS, ROUTINE W REFLEX MICROSCOPIC
BILIRUBIN URINE: NEGATIVE
Glucose, UA: NEGATIVE mg/dL
Hgb urine dipstick: NEGATIVE
KETONES UR: NEGATIVE mg/dL
NITRITE: NEGATIVE
PROTEIN: NEGATIVE mg/dL
Specific Gravity, Urine: 1.021 (ref 1.005–1.030)
pH: 6 (ref 5.0–8.0)

## 2017-02-18 LAB — BASIC METABOLIC PANEL
ANION GAP: 5 (ref 5–15)
BUN: 7 mg/dL (ref 6–20)
CALCIUM: 9.2 mg/dL (ref 8.9–10.3)
CO2: 24 mmol/L (ref 22–32)
Chloride: 105 mmol/L (ref 101–111)
Creatinine, Ser: 0.59 mg/dL (ref 0.44–1.00)
GFR calc Af Amer: >60 mL/min (ref >60)
GLUCOSE: 87 mg/dL (ref 65–99)
Potassium: 3.7 mmol/L (ref 3.5–5.1)
SODIUM: 134 mmol/L — AB (ref 135–145)

## 2017-02-18 LAB — CBC WITH DIFFERENTIAL/PLATELET
BASOS ABS: 0 10*3/uL (ref 0.0–0.1)
BASOS PCT: 0 %
EOS ABS: 0.2 10*3/uL (ref 0.0–0.7)
EOS PCT: 3 %
HCT: 28.7 % — ABNORMAL LOW (ref 36.0–46.0)
Hemoglobin: 9.7 g/dL — ABNORMAL LOW (ref 12.0–15.0)
LYMPHS PCT: 29 %
Lymphs Abs: 1.6 10*3/uL (ref 0.7–4.0)
MCH: 27.8 pg (ref 26.0–34.0)
MCHC: 33.8 g/dL (ref 30.0–36.0)
MCV: 82.2 fL (ref 78.0–100.0)
MONO ABS: 0.2 10*3/uL (ref 0.1–1.0)
Monocytes Relative: 4 %
Neutro Abs: 3.6 10*3/uL (ref 1.7–7.7)
Neutrophils Relative %: 64 %
PLATELETS: 245 10*3/uL (ref 150–400)
RBC: 3.49 MIL/uL — ABNORMAL LOW (ref 3.87–5.11)
RDW: 14.7 % (ref 11.5–15.5)
WBC: 5.6 10*3/uL (ref 4.0–10.5)

## 2017-02-18 LAB — POCT PREGNANCY, URINE: PREG TEST UR: POSITIVE — AB

## 2017-02-18 LAB — WET PREP, GENITAL
Sperm: NONE SEEN
Trich, Wet Prep: NONE SEEN
YEAST WET PREP: NONE SEEN

## 2017-02-18 NOTE — Discharge Instructions (Signed)
No smoking, no drugs, no alcohol.   Take a prenatal vitamin one by mouth every day.   Eat small frequent snacks to avoid nausea.   Begin prenatal care as soon as possible. Take Tylenol 325 mg 2 tablets by mouth every 4 hours if needed for pain. Drink at least 8 8-oz glasses of water every day.

## 2017-02-18 NOTE — MAU Provider Note (Signed)
[facility] Provider Note   CSN: [id number] Arrival date & time: 02/18/17  1633     History   Chief Complaint Chief Complaint  Patient presents with  . Dizziness  . Emesis    HPI Kathryn Silva is a 21 y.o. G2P0010 her for n/v that started last month and has been off and on. Positive HPT. LMP 09/17/16. Patient reports no vaginal bleeding. She vomits every morning.  Dizziness  This is a new problem. The current episode started more than 1 month ago. The problem occurs intermittently. The problem has been gradually worsening. Associated symptoms include chills, nausea and vomiting. Pertinent negatives include no abdominal pain, chest pain, coughing, fever, headaches or rash. She has tried nothing for the symptoms.  Emesis   Associated symptoms include chills. Pertinent negatives include no abdominal pain, chest pain, coughing, fever or headaches.    Past Medical History:  Diagnosis Date  . Asthma   . History of intrauterine death 10/10/16   2016/09/21: 11-12wks. No PNC. Came into OB triage VB, pain and BBOW. See surg path for fetus (+abnormalities). Needed D&C for rPOCs. Negative KB, TSH, rpr, hiv, cbc, GC/CT, wet prep, U/a, UDS.   Marland Kitchen Medical history non-contributory     Patient Active Problem List   Diagnosis Date Noted  . History of intrauterine death 10-10-2016    Past Surgical History:  Procedure Laterality Date  . DILATION AND EVACUATION N/A 09/22/2016   Procedure: SUCTION DILATATION AND EVACUATION;  Surgeon: [place] Bing, MD;  Location: [facility];  Service: Gynecology;  Laterality: N/A;  . NO PAST SURGERIES      OB History    Gravida Para Term Preterm AB Living   2       1 0   SAB TAB Ectopic Multiple Live Births   1               Home Medications    Prior to Admission medications   Medication Sig Start Date End Date Taking? Authorizing Provider  docusate sodium (COLACE) 100 MG capsule Take 1 capsule (100 mg total) by mouth 2 (two) times daily. Patient  not taking: Reported on 10/12/2016 09/22/16   [place] Bing, MD  ferrous gluconate (FERGON) 324 MG tablet Take 1 tablet (324 mg total) by mouth 2 (two) times daily with a meal. Patient not taking: Reported on 10/12/2016 09/22/16   [place] Bing, MD  ibuprofen (ADVIL,MOTRIN) 600 MG tablet Take 1 tablet (600 mg total) by mouth every 6 (six) hours as needed. Patient not taking: Reported on 10/12/2016 09/22/16   [place] Bing, MD  medroxyPROGESTERone (DEPO-PROVERA) 150 MG/ML injection Inject 1 mL (150 mg total) into the muscle every 3 (three) months. 10/12/16   Boyd Bing, MD  oxyCODONE-acetaminophen (PERCOCET/ROXICET) 5-325 MG tablet Take 1 tablet by mouth every 6 (six) hours as needed. Patient not taking: Reported on 10/12/2016 09/22/16   [place] Bing, MD    Family History No family history on file.  Social History Social History  Substance Use Topics  . Smoking status: Never Smoker  . Smokeless tobacco: Never Used  . Alcohol use No     Allergies   Patient has no known allergies.   Review of Systems Review of Systems  Constitutional: Positive for chills. Negative for fever.  HENT: Negative.   Eyes: Negative for discharge, redness, itching and visual disturbance.  Respiratory: Negative for cough, chest tightness and shortness of breath.   Cardiovascular: Negative for chest pain.  Gastrointestinal: Positive for nausea and  vomiting. Negative for abdominal pain.  Genitourinary: Negative for decreased urine volume, dysuria, frequency and urgency.  Musculoskeletal: Negative for back pain.  Skin: Negative for rash.  Neurological: Negative for syncope and headaches.  Psychiatric/Behavioral: The patient is not nervous/anxious.      Physical Exam Updated Vital Signs BP 112/70   Temp 98.6 F (37 C)   Ht 4\' 11"  (1.499 m)   Wt 135 lb (61.2 kg)   LMP 09/17/2016 (LMP Unknown)   SpO2 100%   Breastfeeding? Unknown   BMI 27.27 kg/m   Physical Exam  Constitutional: She is  oriented to person, place, and time. She appears well-developed and well-nourished. No distress.  HENT:  Head: Normocephalic and atraumatic.  Eyes: EOM are normal.  Neck: Neck supple.  Cardiovascular: Normal rate.   Pulmonary/Chest: Effort normal.  Abdominal: Soft. There is no tenderness.  Uterus at the umbilicus  Genitourinary:  Genitourinary Comments: External genitalia without lesions, mucous d/c vaginal vault. Cervix long, closed, no CMT, uterus approximately 20-22 week size.  Musculoskeletal: Normal range of motion.  Neurological: She is alert and oriented to person, place, and time. No cranial nerve deficit.  Skin: Skin is warm and dry.  Psychiatric: She has a normal mood and affect. Her behavior is normal.  Nursing note and vitals reviewed.    ED Treatments / Results  Labs (all labs ordered are listed, but only abnormal results are displayed)  Results for orders placed or performed during the hospital encounter of 02/18/17 (from the past 24 hour(s))  Urinalysis, Routine w reflex microscopic     Status: Abnormal   Collection Time: 02/18/17  4:45 PM  Result Value Ref Range   Color, Urine YELLOW YELLOW   APPearance HAZY (A) CLEAR   Specific Gravity, Urine 1.021 1.005 - 1.030   pH 6.0 5.0 - 8.0   Glucose, UA NEGATIVE NEGATIVE mg/dL   Hgb urine dipstick NEGATIVE NEGATIVE   Bilirubin Urine NEGATIVE NEGATIVE   Ketones, ur NEGATIVE NEGATIVE mg/dL   Protein, ur NEGATIVE NEGATIVE mg/dL   Nitrite NEGATIVE NEGATIVE   Leukocytes, UA MODERATE (A) NEGATIVE   RBC / HPF 0-5 0 - 5 RBC/hpf   WBC, UA 0-5 0 - 5 WBC/hpf   Bacteria, UA RARE (A) NONE SEEN   Squamous Epithelial / LPF 6-30 (A) NONE SEEN   Mucous PRESENT   Pregnancy, urine POC     Status: Abnormal   Collection Time: 02/18/17  5:01 PM  Result Value Ref Range   Preg Test, Ur POSITIVE (A) NEGATIVE  CBC with Differential/Platelet     Status: Abnormal   Collection Time: 02/18/17  5:50 PM  Result Value Ref Range   WBC 5.6 4.0  - 10.5 K/uL   RBC 3.49 (L) 3.87 - 5.11 MIL/uL   Hemoglobin 9.7 (L) 12.0 - 15.0 g/dL   HCT 40.9 (L) 81.1 - 91.4 %   MCV 82.2 78.0 - 100.0 fL   MCH 27.8 26.0 - 34.0 pg   MCHC 33.8 30.0 - 36.0 g/dL   RDW 78.2 95.6 - 21.3 %   Platelets 245 150 - 400 K/uL   Neutrophils Relative % 64 %   Neutro Abs 3.6 1.7 - 7.7 K/uL   Lymphocytes Relative 29 %   Lymphs Abs 1.6 0.7 - 4.0 K/uL   Monocytes Relative 4 %   Monocytes Absolute 0.2 0.1 - 1.0 K/uL   Eosinophils Relative 3 %   Eosinophils Absolute 0.2 0.0 - 0.7 K/uL   Basophils Relative 0 %  Basophils Absolute 0.0 0.0 - 0.1 K/uL  Basic metabolic panel     Status: Abnormal   Collection Time: 02/18/17  5:50 PM  Result Value Ref Range   Sodium 134 (L) 135 - 145 mmol/L   Potassium 3.7 3.5 - 5.1 mmol/L   Chloride 105 101 - 111 mmol/L   CO2 24 22 - 32 mmol/L   Glucose, Bld 87 65 - 99 mg/dL   BUN 7 6 - 20 mg/dL   Creatinine, Ser 4.090.59 0.44 - 1.00 mg/dL   Calcium 9.2 8.9 - 81.110.3 mg/dL   GFR calc non Af Amer >60 >60 mL/min   GFR calc Af Amer >60 >60 mL/min   Anion gap 5 5 - 15  Wet prep, genital     Status: Abnormal   Collection Time: 02/18/17  6:55 PM  Result Value Ref Range   Yeast Wet Prep HPF POC NONE SEEN NONE SEEN   Trich, Wet Prep NONE SEEN NONE SEEN   Clue Cells Wet Prep HPF POC PRESENT (A) NONE SEEN   WBC, Wet Prep HPF POC MODERATE (A) NONE SEEN   Sperm NONE SEEN      Radiology Preliminary US results reviewed - 4277w5d with EDC of 07-24-17  Procedures Procedures (including critical care time)  Medications Ordered in ED Medications - No data to display   Initial Impression / Assessment and Plan / ED Course  I have reviewed the triage vital signs and the nursing notes.   New Prescriptions Current Discharge Medication List     Patient awaiting ultrasound for dating. Care turned over to Nolene Bernheimerri Taisia Fantini, NP @ 7:45 pm.   Assessment 6477w5d gestation  Plan No smoking, no drugs, no alcohol.   Take a prenatal vitamin one by  mouth every day.   Eat small frequent snacks to avoid nausea.   Begin prenatal care as soon as possible. Take Tylenol 325 mg 2 tablets by mouth every 4 hours if needed for pain. Drink at least 8 8-oz glasses of water every day.

## 2017-02-18 NOTE — MAU Note (Cosign Needed)
Patient vomiting x 1 month or 2 having dizziness, unsure of LMP

## 2017-02-19 LAB — GC/CHLAMYDIA PROBE AMP (~~LOC~~) NOT AT ARMC
CHLAMYDIA, DNA PROBE: POSITIVE — AB
NEISSERIA GONORRHEA: NEGATIVE

## 2017-02-20 ENCOUNTER — Telehealth: Payer: Self-pay | Admitting: Medical

## 2017-02-20 DIAGNOSIS — A749 Chlamydial infection, unspecified: Secondary | ICD-10-CM

## 2017-02-20 LAB — RPR: RPR: NONREACTIVE

## 2017-02-20 LAB — HIV ANTIBODY (ROUTINE TESTING W REFLEX): HIV Screen 4th Generation wRfx: NONREACTIVE

## 2017-02-20 MED ORDER — AZITHROMYCIN 250 MG PO TABS: 1000.0000 mg | ORAL_TABLET | Freq: Once | ORAL | 0 refills | Status: AC

## 2017-02-20 NOTE — Telephone Encounter (Addendum)
Kathryn Silva tested positive for  Chlamydia. Patient was called by RN and allergies and pharmacy confirmed. Rx sent to pharmacy of choice.   Kathryn Silva, Kathryn Lipson N, PA-C 02/20/2017 11:28 AM       ----- Message from Kathe BectonLori S Berdik, RN sent at 02/20/2017 11:08 AM EDT ----- This patient tested posit ive for chlamydia  She :"has NKDA" I have informed the patient of her results and confirmed her pharmacy is correct in her chart. Please send Rx.   Thank you,   Kathe BectonBerdik, Lori S, RN   [facility]

## 2017-02-24 ENCOUNTER — Other Ambulatory Visit: Payer: Self-pay | Admitting: Medical

## 2017-02-24 DIAGNOSIS — A749 Chlamydial infection, unspecified: Secondary | ICD-10-CM

## 2017-02-27 ENCOUNTER — Other Ambulatory Visit: Payer: Self-pay | Admitting: *Deleted

## 2017-02-27 MED ORDER — AZITHROMYCIN 250 MG PO TABS: 1000.0000 mg | ORAL_TABLET | Freq: Once | ORAL | 0 refills | Status: AC

## 2017-02-27 NOTE — Progress Notes (Signed)
Fax message received from pharmacy dated 7/28. It stated that pt received the original Rx however the tablets got wet and were destroyed. Pt requested refill of same.  Rx refill e-prescribed as requested.

## 2017-03-19 ENCOUNTER — Encounter: Payer: Self-pay | Admitting: Medical

## 2017-03-19 ENCOUNTER — Other Ambulatory Visit (HOSPITAL_COMMUNITY)
Admission: RE | Admit: 2017-03-19 | Discharge: 2017-03-19 | Disposition: A | Discharge status: Home / Self Care | Payer: Medicaid Other | Source: Ambulatory Visit | Attending: Medical | Admitting: Medical

## 2017-03-19 ENCOUNTER — Ambulatory Visit (INDEPENDENT_AMBULATORY_CARE_PROVIDER_SITE_OTHER): Payer: Self-pay | Admitting: Medical

## 2017-03-19 DIAGNOSIS — Z3A00 Weeks of gestation of pregnancy not specified: Secondary | ICD-10-CM | POA: Insufficient documentation

## 2017-03-19 DIAGNOSIS — B373 Candidiasis of vulva and vagina: Secondary | ICD-10-CM | POA: Insufficient documentation

## 2017-03-19 DIAGNOSIS — O98819 Other maternal infectious and parasitic diseases complicating pregnancy, unspecified trimester: Secondary | ICD-10-CM | POA: Diagnosis not present

## 2017-03-19 DIAGNOSIS — A749 Chlamydial infection, unspecified: Secondary | ICD-10-CM | POA: Diagnosis not present

## 2017-03-19 DIAGNOSIS — Z3480 Encounter for supervision of other normal pregnancy, unspecified trimester: Secondary | ICD-10-CM | POA: Diagnosis present

## 2017-03-19 DIAGNOSIS — Z3482 Encounter for supervision of other normal pregnancy, second trimester: Secondary | ICD-10-CM

## 2017-03-19 HISTORY — DX: Encounter for supervision of other normal pregnancy, unspecified trimester: Z34.80

## 2017-03-19 LAB — POCT URINALYSIS DIP (DEVICE)
Bilirubin Urine: NEGATIVE
GLUCOSE, UA: NEGATIVE mg/dL
Hgb urine dipstick: NEGATIVE
Ketones, ur: NEGATIVE mg/dL
NITRITE: NEGATIVE
Protein, ur: NEGATIVE mg/dL
SPECIFIC GRAVITY, URINE: 1.02 (ref 1.005–1.030)
UROBILINOGEN UA: 0.2 mg/dL (ref 0.0–1.0)
pH: 7.5 (ref 5.0–8.0)

## 2017-03-19 NOTE — Patient Instructions (Addendum)
AREA PEDIATRIC/FAMILY PRACTICE PHYSICIANS  [facility] [address], [address] [place], [place]  [address] Phone - [phone]   Fax - [phone]  [facility] [address] [place], [place] [address] Phone - [phone]   Fax - [phone]  JACK AMOS [address] [place], [place]  [address] Phone - [phone]   Fax - [phone]  [facility] [address] [place], [place]  [address] Phone - [phone]   Fax - [phone]  [facility] [address] [place], [place]  [address] Phone - [phone]   Fax - [phone]  [facility] [address] [place], [place]  [address] Phone - [phone]   Fax - [phone]  [facility] [address] [place], [place]  [address] Phone - [phone]   Fax - [phone]  [facility] [address], [address] [place], [place]  [address] Phone - [phone]   Fax - [phone]  [facility] [address] [place], [place]  [address] Phone - [phone]   Fax - [phone] [facility] [address] [place], [place]  [address] Phone - [phone]   Fax - [phone]  [facility] [address] [place], [place]  [address] Phone - [phone]   Fax - [phone]  [facility] [address] [place], [place]  [address] Phone - [phone]   Fax - [phone]  [facility] [address], [address] [place], [place]  [address] Phone - [phone]   Fax - [phone]  SHILPA GOSRANI [address] [place], [place]  [address] Phone - [phone]  [facility] [address] [place], [place]  [address] Phone - [phone]   Fax - [phone]  [facility] [address] [place], [place]  [address] Phone - [phone]   Fax - [phone]  [facility] [address] [place], [place]  [address] Phone - [phone]   Fax - [phone]  [facility] [address] [place], [place]  [address] Phone - [phone]   Fax - [phone]   [facility] [address] [place], [place]  [address] Phone - [phone]   Fax - [phone]  [facility] [address] [place], [place]  [address] Phone - [phone]   Fax - [phone]  DAVID RUBIN [address] [place], [place]  [address] Phone - [phone]   Fax - [phone]  [facility] [address] , [place]  [address] Phone - [phone]   Fax - [phone]  [facility] [address] , [place]  [address] Phone - [phone]   Fax - [phone] [facility] [address] [place], [place]  [address] Phone - [phone]   Fax - [phone]  [facility] [address] [place], [place]  [address] Phone - [phone]   Fax - [phone]  [facility] [address] [place], [place]  [address] Phone - [phone]   Fax - [phone]  [facility] Charlene Flemming MD [address] [place]  [address] Phone [phone]  Fax [phone]      How a Baby Grows During Pregnancy Pregnancy begins when a female's   sperm enters a female's egg (fertilization). This happens in one of the tubes (fallopian tubes) that connect the ovaries to the womb (uterus). The fertilized egg is called an embryo until it reaches 10 weeks. From 10 weeks until birth, it is called a fetus. The fertilized egg moves down the fallopian tube to the uterus. Then it implants into the lining of the uterus and begins to grow. The developing fetus receives  oxygen and nutrients through the pregnant woman's bloodstream and the tissues that grow (placenta) to support the fetus. The placenta is the life support system for the fetus. It provides nutrition and removes waste. Learning as much as you can about your pregnancy and how your baby is developing can help you enjoy the experience. It can also make you aware of when there might be a problem and when to ask questions. How long does a typical pregnancy last? A pregnancy usually lasts 280 days, or about 40 weeks. Pregnancy is divided into three trimesters:  First trimester: 0-13 weeks.  Second trimester: 14-27 weeks.  Third trimester: 28-40 weeks.  The day when your baby is considered ready to be born (full term) is your estimated date of delivery. How does my baby develop month by month? First month  The fertilized egg attaches to the inside of the uterus.  Some cells will form the placenta. Others will form the fetus.  The arms, legs, brain, spinal cord, lungs, and heart begin to develop.  At the end of the first month, the heart begins to beat.  Second month  The bones, inner ear, eyelids, hands, and feet form.  The genitals develop.  By the end of 8 weeks, all major organs are developing.  Third month  All of the internal organs are forming.  Teeth develop below the gums.  Bones and muscles begin to grow. The spine can flex.  The skin is transparent.  Fingernails and toenails begin to form.  Arms and legs continue to grow longer, and hands and feet develop.  The fetus is about 3 in (7.6 cm) long.  Fourth month  The placenta is completely formed.  The external sex organs, neck, outer ear, eyebrows, eyelids, and fingernails are formed.  The fetus can hear, swallow, and move its arms and legs.  The kidneys begin to produce urine.  The skin is covered with a white waxy coating (vernix) and very fine hair (lanugo).  Fifth month  The fetus moves around more and  can be felt for the first time (quickening).  The fetus starts to sleep and wake up and may begin to suck its finger.  The nails grow to the end of the fingers.  The organ in the digestive system that makes bile (gallbladder) functions and helps to digest the nutrients.  If your baby is a girl, eggs are present in her ovaries. If your baby is a boy, testicles start to move down into his scrotum.  Sixth month  The lungs are formed, but the fetus is not yet able to breathe.  The eyes open. The brain continues to develop.  Your baby has fingerprints and toe prints. Your baby's hair grows thicker.  At the end of the second trimester, the fetus is about 9 in (22.9 cm) long.  Seventh month  The fetus kicks and stretches.  The eyes are developed enough to sense changes in light.  The hands can make a grasping motion.  The fetus responds to sound.  Eighth month  All organs   and body systems are fully developed and functioning.  Bones harden and taste buds develop. The fetus may hiccup.  Certain areas of the brain are still developing. The skull remains soft.  Ninth month  The fetus gains about  lb (0.23 kg) each week.  The lungs are fully developed.  Patterns of sleep develop.  The fetus's head typically moves into a head-down position (vertex) in the uterus to prepare for birth. If the buttocks move into a vertex position instead, the baby is breech.  The fetus weighs 6-9 lbs (2.72-4.08 kg) and is 19-20 in (48.26-50.8 cm) long.  What can I do to have a healthy pregnancy and help my baby develop? Eating and Drinking  Eat a healthy diet. ? Talk with your health care provider to make sure that you are getting the nutrients that you and your baby need. ? Visit www.choosemyplate.gov to learn about creating a healthy diet.  Gain a healthy amount of weight during pregnancy as advised by your health care provider. This is usually 25-35 pounds. You may need to: ? Gain more  if you were underweight before getting pregnant or if you are pregnant with more than one baby. ? Gain less if you were overweight or obese when you got pregnant.  Medicines and Vitamins  Take prenatal vitamins as directed by your health care provider. These include vitamins such as folic acid, iron, calcium, and vitamin D. They are important for healthy development.  Take medicines only as directed by your health care provider. Read labels and ask a pharmacist or your health care provider whether over-the-counter medicines, supplements, and prescription drugs are safe to take during pregnancy.  Activities  Be physically active as advised by your health care provider. Ask your health care provider to recommend activities that are safe for you to do, such as walking or swimming.  Do not participate in strenuous or extreme sports.  Lifestyle  Do not drink alcohol.  Do not use any tobacco products, including cigarettes, chewing tobacco, or electronic cigarettes. If you need help quitting, ask your health care provider.  Do not use illegal drugs.  Safety  Avoid exposure to mercury, lead, or other heavy metals. Ask your health care provider about common sources of these heavy metals.  Avoid listeria infection during pregnancy. Follow these precautions: ? Do not eat soft cheeses or deli meats. ? Do not eat hot dogs unless they have been warmed up to the point of steaming, such as in the microwave oven. ? Do not drink unpasteurized milk.  Avoid toxoplasmosis infection during pregnancy. Follow these precautions: ? Do not change your cat's litter box, if you have a cat. Ask someone else to do this for you. ? Wear gardening gloves while working in the yard.  General Instructions  Keep all follow-up visits as directed by your health care provider. This is important. This includes prenatal care and screening tests.  Manage any chronic health conditions. Work closely with your health care  provider to keep conditions, such as diabetes, under control.  How do I know if my baby is developing well? At each prenatal visit, your health care provider will do several different tests to check on your health and keep track of your baby's development. These include:  Fundal height. ? Your health care provider will measure your growing belly from top to bottom using a tape measure. ? Your health care provider will also feel your belly to determine your baby's position.  Heartbeat. ? An ultrasound   in the first trimester can confirm pregnancy and show a heartbeat, depending on how far along you are. ? Your health care provider will check your baby's heart rate at every prenatal visit. ? As you get closer to your delivery date, you may have regular fetal heart rate monitoring to make sure that your baby is not in distress.  Second trimester ultrasound. ? This ultrasound checks your baby's development. It also indicates your baby's gender.  What should I do if I have concerns about my baby's development? Always talk with your health care provider about any concerns that you may have. This information is not intended to replace advice given to you by your health care provider. Make sure you discuss any questions you have with your health care provider. Document Released: 01/03/2008 Document Revised: 12/23/2015 Document Reviewed: 12/24/2013 Elsevier Interactive Patient Education  2018 Elsevier Inc.  

## 2017-03-19 NOTE — Progress Notes (Signed)
Patient is here today as a New OB [redacted]w[redacted]d.

## 2017-03-19 NOTE — Progress Notes (Signed)
   PRENATAL VISIT NOTE  Subjective:  Kathryn Silva is a 21 y.o. G2P0010 at [redacted]w[redacted]d being seen today for her initial prenatal care visit.  She is currently monitored for the following issues for this low-risk pregnancy and has History of intrauterine death and Supervision of other normal pregnancy, antepartum on her problem list.  Patient reports no complaints.  Contractions: Not present. Vag. Bleeding: None.  Movement: Present. Denies leaking of fluid.   The following portions of the patient's history were reviewed and updated as appropriate: allergies, current medications, past family history, past medical history, past social history, past surgical history and problem list. Problem list updated.  Objective:   Vitals:   03/19/17 1254  BP: 135/63  Pulse: 89  Weight: 138 lb 4.8 oz (62.7 kg)    Fetal Status: Fetal Heart Rate (bpm): 160   Movement: Present     General:  Alert, oriented and cooperative. Patient is in no acute distress.  Skin: Skin is warm and dry. No rash noted.   Cardiovascular: Normal heart rate and rhythm noted  Respiratory: Normal respiratory effort, no problems with respiration noted, clear to auscultation   Abdomen: Soft, gravid, appropriate for gestational age.  Pain/Pressure: Absent     Pelvic: Cervical exam performed       closed/thick/firm  Extremities: Normal range of motion.     Mental Status:  Normal mood and affect. Normal behavior. Normal judgment and thought content.   Assessment and Plan:  Pregnancy: G2P0010 at [redacted]w[redacted]d  1. Supervision of other normal pregnancy, antepartum - Obstetric Panel, Including HIV - Culture, OB Urine - Hemoglobinopathy evaluation - [place] MFM OB COMP + 14 WK; scheduled - Cervicovaginal ancillary only - TOC for Chlamydia  Preterm labor symptoms and general obstetric precautions including but not limited to vaginal bleeding, contractions, leaking of fluid and fetal movement were reviewed in detail with the patient. Please refer to  After Visit Summary for other counseling recommendations.  Return in about 4 weeks (around 04/16/2017) for LOB.   Vonzella Nipple, PA-C

## 2017-03-20 LAB — CERVICOVAGINAL ANCILLARY ONLY
BACTERIAL VAGINITIS: NEGATIVE
CANDIDA VAGINITIS: POSITIVE — AB
CHLAMYDIA, DNA PROBE: POSITIVE — AB
Neisseria Gonorrhea: NEGATIVE
TRICH (WINDOWPATH): NEGATIVE

## 2017-03-21 ENCOUNTER — Telehealth: Payer: Self-pay | Admitting: Lab

## 2017-03-21 ENCOUNTER — Other Ambulatory Visit: Payer: Self-pay | Admitting: Medical

## 2017-03-21 DIAGNOSIS — B3731 Acute candidiasis of vulva and vagina: Secondary | ICD-10-CM

## 2017-03-21 DIAGNOSIS — A749 Chlamydial infection, unspecified: Secondary | ICD-10-CM

## 2017-03-21 DIAGNOSIS — B373 Candidiasis of vulva and vagina: Secondary | ICD-10-CM

## 2017-03-21 DIAGNOSIS — O98812 Other maternal infectious and parasitic diseases complicating pregnancy, second trimester: Principal | ICD-10-CM

## 2017-03-21 LAB — OBSTETRIC PANEL, INCLUDING HIV
ANTIBODY SCREEN: NEGATIVE
Basophils Absolute: 0 10*3/uL (ref 0.0–0.2)
Basos: 0 %
EOS (ABSOLUTE): 0.1 10*3/uL (ref 0.0–0.4)
EOS: 2 %
HEMOGLOBIN: 10 g/dL — AB (ref 11.1–15.9)
HEP B S AG: NEGATIVE
HIV SCREEN 4TH GENERATION: NONREACTIVE
Hematocrit: 29.3 % — ABNORMAL LOW (ref 34.0–46.6)
Immature Grans (Abs): 0 10*3/uL (ref 0.0–0.1)
Immature Granulocytes: 0 %
LYMPHS ABS: 1.5 10*3/uL (ref 0.7–3.1)
Lymphs: 25 %
MCH: 29.2 pg (ref 26.6–33.0)
MCHC: 34.1 g/dL (ref 31.5–35.7)
MCV: 85 fL (ref 79–97)
Monocytes Absolute: 0.5 10*3/uL (ref 0.1–0.9)
Monocytes: 8 %
NEUTROS ABS: 3.8 10*3/uL (ref 1.4–7.0)
Neutrophils: 65 %
Platelets: 265 10*3/uL (ref 150–379)
RBC: 3.43 x10E6/uL — ABNORMAL LOW (ref 3.77–5.28)
RDW: 16.4 % — ABNORMAL HIGH (ref 12.3–15.4)
RH TYPE: POSITIVE
RPR: NONREACTIVE
Rubella Antibodies, IGG: 6.07 index (ref >0.99)
WBC: 5.9 10*3/uL (ref 3.4–10.8)

## 2017-03-21 LAB — HEMOGLOBINOPATHY EVALUATION
HEMOGLOBIN A2 QUANTITATION: 2.2 % (ref 1.8–3.2)
HGB C: 0 %
HGB S: 0 %
HGB VARIANT: 0 %
Hemoglobin F Quantitation: 0 % (ref 0.0–2.0)
Hgb A: 97.8 % (ref 96.4–98.8)

## 2017-03-21 LAB — CULTURE, OB URINE

## 2017-03-21 LAB — URINE CULTURE, OB REFLEX

## 2017-03-21 MED ORDER — AZITHROMYCIN 250 MG PO TABS: 1000.0000 mg | ORAL_TABLET | Freq: Once | ORAL | 0 refills | Status: DC

## 2017-03-21 MED ORDER — TERCONAZOLE 0.8 % VA CREA: 1.0000 | TOPICAL_CREAM | Freq: Every day | VAGINAL | 0 refills | Status: DC

## 2017-03-21 MED ORDER — AZITHROMYCIN 1 G PO PACK: 1.0000 g | PACK | Freq: Once | ORAL | 0 refills | Status: AC

## 2017-03-21 NOTE — Telephone Encounter (Addendum)
Called patient to tell her she tested positive for Chlamydia. Also told her to sustain from sex for 2 weeks and her partner needs be treated as well. I told the patient I sent a prescription to her pharmacy. Acknowledge she understood.

## 2017-03-27 ENCOUNTER — Ambulatory Visit (HOSPITAL_COMMUNITY)
Admission: RE | Admit: 2017-03-27 | Discharge: 2017-03-27 | Disposition: A | Discharge status: Home / Self Care | Payer: Medicaid Other | Source: Ambulatory Visit | Attending: Medical | Admitting: Medical

## 2017-03-27 ENCOUNTER — Other Ambulatory Visit: Payer: Self-pay | Admitting: Medical

## 2017-03-27 DIAGNOSIS — Z3480 Encounter for supervision of other normal pregnancy, unspecified trimester: Secondary | ICD-10-CM

## 2017-03-27 DIAGNOSIS — Z363 Encounter for antenatal screening for malformations: Secondary | ICD-10-CM | POA: Diagnosis not present

## 2017-03-27 DIAGNOSIS — Z3A23 23 weeks gestation of pregnancy: Secondary | ICD-10-CM

## 2017-03-27 DIAGNOSIS — O352XX0 Maternal care for (suspected) hereditary disease in fetus, not applicable or unspecified: Secondary | ICD-10-CM | POA: Insufficient documentation

## 2017-04-16 ENCOUNTER — Encounter: Payer: Self-pay | Admitting: Medical

## 2017-04-16 ENCOUNTER — Encounter: Payer: Self-pay | Admitting: Obstetrics & Gynecology

## 2017-04-16 ENCOUNTER — Ambulatory Visit (INDEPENDENT_AMBULATORY_CARE_PROVIDER_SITE_OTHER): Payer: Medicaid Other | Admitting: Obstetrics & Gynecology

## 2017-04-16 DIAGNOSIS — Z3482 Encounter for supervision of other normal pregnancy, second trimester: Secondary | ICD-10-CM | POA: Diagnosis not present

## 2017-04-16 DIAGNOSIS — Z3480 Encounter for supervision of other normal pregnancy, unspecified trimester: Secondary | ICD-10-CM

## 2017-04-16 NOTE — Progress Notes (Signed)
   PRENATAL VISIT NOTE  Subjective:  Kathryn Silva is a 21 y.o. G2P0010 at [redacted]w[redacted]d being seen today for ongoing prenatal care.  She is currently monitored for the following issues for this low-risk pregnancy and has History of first trimester miscarriage, currently pregnant and Supervision of other normal pregnancy, antepartum on her problem list.  Patient reports no complaints.  Contractions: Not present. Vag. Bleeding: None.  Movement: Present. Denies leaking of fluid.   The following portions of the patient's history were reviewed and updated as appropriate: allergies, current medications, past family history, past medical history, past social history, past surgical history and problem list. Problem list updated.  Objective:   Vitals:   04/16/17 1345  BP: 120/69  Pulse: 82  Weight: 149 lb 9.6 oz (67.9 kg)    Fetal Status: Fetal Heart Rate (bpm): 142 Fundal Height: 25 cm Movement: Present     General:  Alert, oriented and cooperative. Patient is in no acute distress.  Skin: Skin is warm and dry. No rash noted.   Cardiovascular: Normal heart rate noted  Respiratory: Normal respiratory effort, no problems with respiration noted  Abdomen: Soft, gravid, appropriate for gestational age.  Pain/Pressure: Absent     Pelvic: Cervical exam deferred        Extremities: Normal range of motion.  Edema: None  Mental Status:  Normal mood and affect. Normal behavior. Normal judgment and thought content.   Assessment and Plan:  Pregnancy: G2P0010 at [redacted]w[redacted]d  1. Supervision of other normal pregnancy, antepartum Preterm labor symptoms and general obstetric precautions including but not limited to vaginal bleeding, contractions, leaking of fluid and fetal movement were reviewed in detail with the patient. Please refer to After Visit Summary for other counseling recommendations.  Return in about 3 weeks (around 05/07/2017) for 2 hr GTT, TDap, 3rd trimester labs, OB Visit (LOB).   Jaynie Collins, MD

## 2017-04-16 NOTE — Progress Notes (Signed)
Medicaid Home Form Completed.  

## 2017-04-16 NOTE — Patient Instructions (Signed)
Return to clinic for any scheduled appointments or obstetric concerns, or go to [facility] for evaluation  Tdap Vaccine (Tetanus, Diphtheria and Pertussis): What You Need to Know 1. Why get vaccinated? Tetanus, diphtheria and pertussis are very serious diseases. Tdap vaccine can protect us from these diseases. And, Tdap vaccine given to pregnant women can protect newborn babies against pertussis. TETANUS (Lockjaw) is rare in the [place] today. It causes painful muscle tightening and stiffness, usually all over the body.  It can lead to tightening of muscles in the head and neck so you can't open your mouth, swallow, or sometimes even breathe. Tetanus kills about 1 out of 10 people who are infected even after receiving the best medical care. DIPHTHERIA is also rare in the [place] today. It can cause a thick coating to form in the back of the throat.  It can lead to breathing problems, heart failure, paralysis, and death. PERTUSSIS (Whooping Cough) causes severe coughing spells, which can cause difficulty breathing, vomiting and disturbed sleep.  It can also lead to weight loss, incontinence, and rib fractures. Up to 2 in 100 adolescents and 5 in 100 adults with pertussis are hospitalized or have complications, which could include pneumonia or death. These diseases are caused by bacteria. Diphtheria and pertussis are spread from person to person through secretions from coughing or sneezing. Tetanus enters the body through cuts, scratches, or wounds. Before vaccines, as many as 200,000 cases of diphtheria, 200,000 cases of pertussis, and hundreds of cases of tetanus, were reported in the [place] each year. Since vaccination began, reports of cases for tetanus and diphtheria have dropped by about 99% and for pertussis by about 80%. 2. Tdap vaccine Tdap vaccine can protect adolescents and adults from tetanus, diphtheria, and pertussis. One dose of Tdap is routinely given at age 11 or 12.  People who did not get Tdap at that age should get it as soon as possible. Tdap is especially important for healthcare professionals and anyone having close contact with a baby younger than 12 months. Pregnant women should get a dose of Tdap during every pregnancy, to protect the newborn from pertussis. Infants are most at risk for severe, life-threatening complications from pertussis. Another vaccine, called Td, protects against tetanus and diphtheria, but not pertussis. A Td booster should be given every 10 years. Tdap may be given as one of these boosters if you have never gotten Tdap before. Tdap may also be given after a severe cut or burn to prevent tetanus infection. Your doctor or the person giving you the vaccine can give you more information. Tdap may safely be given at the same time as other vaccines. 3. Some people should not get this vaccine  A person who has ever had a life-threatening allergic reaction after a previous dose of any diphtheria, tetanus or pertussis containing vaccine, OR has a severe allergy to any part of this vaccine, should not get Tdap vaccine. Tell the person giving the vaccine about any severe allergies.  Anyone who had coma or long repeated seizures within 7 days after a childhood dose of DTP or DTaP, or a previous dose of Tdap, should not get Tdap, unless a cause other than the vaccine was found. They can still get Td.  Talk to your doctor if you:  have seizures or another nervous system problem,  had severe pain or swelling after any vaccine containing diphtheria, tetanus or pertussis,  ever had a condition called Guillain-Barr Syndrome (GBS),  aren't feeling   well on the day the shot is scheduled. 4. Risks With any medicine, including vaccines, there is a chance of side effects. These are usually mild and go away on their own. Serious reactions are also possible but are rare. Most people who get Tdap vaccine do not have any problems with it. Mild  problems following Tdap:  (Did not interfere with activities)  Pain where the shot was given (about 3 in 4 adolescents or 2 in 3 adults)  Redness or swelling where the shot was given (about 1 person in 5)  Mild fever of at least 100.4F (up to about 1 in 25 adolescents or 1 in 100 adults)  Headache (about 3 or 4 people in 10)  Tiredness (about 1 person in 3 or 4)  Nausea, vomiting, diarrhea, stomach ache (up to 1 in 4 adolescents or 1 in 10 adults)  Chills, sore joints (about 1 person in 10)  Body aches (about 1 person in 3 or 4)  Rash, swollen glands (uncommon) Moderate problems following Tdap:  (Interfered with activities, but did not require medical attention)  Pain where the shot was given (up to 1 in 5 or 6)  Redness or swelling where the shot was given (up to about 1 in 16 adolescents or 1 in 12 adults)  Fever over 102F (about 1 in 100 adolescents or 1 in 250 adults)  Headache (about 1 in 7 adolescents or 1 in 10 adults)  Nausea, vomiting, diarrhea, stomach ache (up to 1 or 3 people in 100)  Swelling of the entire arm where the shot was given (up to about 1 in 500). Severe problems following Tdap:  (Unable to perform usual activities; required medical attention)  Swelling, severe pain, bleeding and redness in the arm where the shot was given (rare). Problems that could happen after any vaccine:   People sometimes faint after a medical procedure, including vaccination. Sitting or lying down for about 15 minutes can help prevent fainting, and injuries caused by a fall. Tell your doctor if you feel dizzy, or have vision changes or ringing in the ears.  Some people get severe pain in the shoulder and have difficulty moving the arm where a shot was given. This happens very rarely.  Any medication can cause a severe allergic reaction. Such reactions from a vaccine are very rare, estimated at fewer than 1 in a million doses, and would happen within a few minutes to a few  hours after the vaccination. As with any medicine, there is a very remote chance of a vaccine causing a serious injury or death. The safety of vaccines is always being monitored. For more information, visit: www.cdc.gov/vaccinesafety/ 5. What if there is a serious problem? What should I look for?  Look for anything that concerns you, such as signs of a severe allergic reaction, very high fever, or unusual behavior. Signs of a severe allergic reaction can include hives, swelling of the face and throat, difficulty breathing, a fast heartbeat, dizziness, and weakness. These would usually start a few minutes to a few hours after the vaccination. What should I do?   If you think it is a severe allergic reaction or other emergency that can't wait, call 9-1-1 or get the person to the nearest hospital. Otherwise, call your doctor.  Afterward, the reaction should be reported to the Vaccine Adverse Event Reporting System (VAERS). Your doctor might file this report, or you can do it yourself through the VAERS web site at www.vaers.hhs.gov, or by calling   1-800-822-7967.  VAERS does not give medical advice. 6. The National Vaccine Injury Compensation Program The National Vaccine Injury Compensation Program (VICP) is a federal program that was created to compensate people who may have been injured by certain vaccines. Persons who believe they may have been injured by a vaccine can learn about the program and about filing a claim by calling 1-800-338-2382 or visiting the VICP website at www.hrsa.gov/vaccinecompensation. There is a time limit to file a claim for compensation. 7. How can I learn more?  Ask your doctor. He or she can give you the vaccine package insert or suggest other sources of information.  Call your local or state health department.  Contact the Centers for Disease Control and Prevention (CDC):  Call 1-800-232-4636 (1-800-CDC-INFO) or  Visit CDC's website at www.cdc.gov/vaccines CDC  Tdap Vaccine VIS (09/23/13) This information is not intended to replace advice given to you by your health care provider. Make sure you discuss any questions you have with your health care provider. Document Released: 01/16/2012 Document Revised: 04/06/2016 Document Reviewed: 04/06/2016 Elsevier Interactive Patient Education  2017 Elsevier Inc.  

## 2017-05-07 ENCOUNTER — Ambulatory Visit (INDEPENDENT_AMBULATORY_CARE_PROVIDER_SITE_OTHER): Payer: Medicaid Other | Admitting: Advanced Practice Midwife

## 2017-05-07 ENCOUNTER — Other Ambulatory Visit (HOSPITAL_COMMUNITY)
Admission: RE | Admit: 2017-05-07 | Discharge: 2017-05-07 | Disposition: A | Discharge status: Home / Self Care | Payer: Medicaid Other | Source: Ambulatory Visit | Attending: Advanced Practice Midwife | Admitting: Advanced Practice Midwife

## 2017-05-07 ENCOUNTER — Encounter: Payer: Self-pay | Admitting: Advanced Practice Midwife

## 2017-05-07 VITALS — BP 113/67 | HR 101 | Wt 146.8 lb

## 2017-05-07 DIAGNOSIS — A749 Chlamydial infection, unspecified: Secondary | ICD-10-CM | POA: Diagnosis not present

## 2017-05-07 DIAGNOSIS — O98813 Other maternal infectious and parasitic diseases complicating pregnancy, third trimester: Secondary | ICD-10-CM

## 2017-05-07 DIAGNOSIS — Z3480 Encounter for supervision of other normal pregnancy, unspecified trimester: Secondary | ICD-10-CM

## 2017-05-07 DIAGNOSIS — Z3483 Encounter for supervision of other normal pregnancy, third trimester: Secondary | ICD-10-CM | POA: Diagnosis present

## 2017-05-07 DIAGNOSIS — Z113 Encounter for screening for infections with a predominantly sexual mode of transmission: Secondary | ICD-10-CM | POA: Diagnosis not present

## 2017-05-07 DIAGNOSIS — O98819 Other maternal infectious and parasitic diseases complicating pregnancy, unspecified trimester: Secondary | ICD-10-CM

## 2017-05-07 NOTE — Progress Notes (Signed)
   PRENATAL VISIT NOTE  Subjective:  Kathryn Silva is a 21 y.o. G2P0010 at [redacted]w[redacted]d being seen today for ongoing prenatal care.  She is currently monitored for the following issues for this low-risk pregnancy and has History of first trimester miscarriage, currently pregnant and Supervision of other normal pregnancy, antepartum on her problem list.  Patient reports no complaints.  Contractions: Not present. Vag. Bleeding: None.  Movement: Present. Denies leaking of fluid.   The following portions of the patient's history were reviewed and updated as appropriate: allergies, current medications, past family history, past medical history, past social history, past surgical history and problem list. Problem list updated.  Objective:   Vitals:   05/07/17 0822  BP: 113/67  Pulse: (!) 101  Weight: 146 lb 12.8 oz (66.6 kg)    Fetal Status: Fetal Heart Rate (bpm): 152   Movement: Present     General:  Alert, oriented and cooperative. Patient is in no acute distress.  Skin: Skin is warm and dry. No rash noted.   Cardiovascular: Normal heart rate noted  Respiratory: Normal respiratory effort, no problems with respiration noted  Abdomen: Soft, gravid, appropriate for gestational age.  Pain/Pressure: Absent     Pelvic: Cervical exam deferred        Extremities: Normal range of motion.  Edema: None  Mental Status:  Normal mood and affect. Normal behavior. Normal judgment and thought content.   Assessment and Plan:  Pregnancy: G2P0010 at [redacted]w[redacted]d  1. Supervision of other normal pregnancy, antepartum - Glucose Tolerance, 2 Hours w/1 Hour - RPR - CBC - HIV antibody - GC/CT (test of cure)  Preterm labor symptoms and general obstetric precautions including but not limited to vaginal bleeding, contractions, leaking of fluid and fetal movement were reviewed in detail with the patient. Please refer to After Visit Summary for other counseling recommendations.  Return in about 2 weeks (around  05/21/2017).   Thressa Sheller, CNM

## 2017-05-07 NOTE — Patient Instructions (Addendum)
Places to have your son circumcised:    [facility] [phone] while you are in hospital  [facility] [phone] $244 by 4 wks  [facility] [phone] $175 by 2 wks  [facility] [phone] $250 by 7 days [facility] [phone] $150 by 4 wks  These prices sometimes change but are roughly what you can expect to pay. Please call and confirm pricing.   Circumcision is considered an elective/non-medically necessary procedure. There are many reasons parents decide to have their sons circumsized. During the first year of life circumcised males have a reduced risk of urinary tract infections but after this year the rates between circumcised males and uncircumcised males are the same.  It is safe to have your son circumcised outside of the hospital and the places above perform them regularly.    Third Trimester of Pregnancy The third trimester is from week 28 through week 40 (months 7 through 9). The third trimester is a time when the unborn baby (fetus) is growing rapidly. At the end of the ninth month, the fetus is about 20 inches in length and weighs 6-10 pounds. Body changes during your third trimester Your body will continue to go through many changes during pregnancy. The changes vary from woman to woman. During the third trimester:  Your weight will continue to increase. You can expect to gain 25-35 pounds (11-16 kg) by the end of the pregnancy.  You may begin to get stretch marks on your hips, abdomen, and breasts.  You may urinate more often because the fetus is moving lower into your pelvis and pressing on your bladder.  You may develop or continue to have heartburn. This is caused by increased hormones that slow down muscles in the digestive tract.  You may develop or continue to have constipation because increased  hormones slow digestion and cause the muscles that push waste through your intestines to relax.  You may develop hemorrhoids. These are swollen veins (varicose veins) in the rectum that can itch or be painful.  You may develop swollen, bulging veins (varicose veins) in your legs.  You may have increased body aches in the pelvis, back, or thighs. This is due to weight gain and increased hormones that are relaxing your joints.  You may have changes in your hair. These can include thickening of your hair, rapid growth, and changes in texture. Some women also have hair loss during or after pregnancy, or hair that feels dry or thin. Your hair will most likely return to normal after your baby is born.  Your breasts will continue to grow and they will continue to become tender. A yellow fluid (colostrum) may leak from your breasts. This is the first milk you are producing for your baby.  Your belly button may stick out.  You may notice more swelling in your hands, face, or ankles.  You may have increased tingling or numbness in your hands, arms, and legs. The skin on your belly may also feel numb.  You may feel short of breath because of your expanding uterus.  You may have more problems sleeping. This can be caused by the size of your belly, increased need to urinate, and an increase in your body's metabolism.  You may notice the fetus "dropping," or moving lower in your abdomen (lightening).  You may have increased vaginal discharge.  You may notice your joints feel loose and you may have pain around your pelvic bone.  What to expect at prenatal visits You will have prenatal exams  every 2 weeks until week 36. Then you will have weekly prenatal exams. During a routine prenatal visit:  You will be weighed to make sure you and the baby are growing normally.  Your blood pressure will be taken.  Your abdomen will be measured to track your baby's growth.  The fetal heartbeat will be  listened to.  Any test results from the previous visit will be discussed.  You may have a cervical check near your due date to see if your cervix has softened or thinned (effaced).  You will be tested for Group B streptococcus. This happens between 35 and 37 weeks.  Your health care provider may ask you:  What your birth plan is.  How you are feeling.  If you are feeling the baby move.  If you have had any abnormal symptoms, such as leaking fluid, bleeding, severe headaches, or abdominal cramping.  If you are using any tobacco products, including cigarettes, chewing tobacco, and electronic cigarettes.  If you have any questions.  Other tests or screenings that may be performed during your third trimester include:  Blood tests that check for low iron levels (anemia).  Fetal testing to check the health, activity level, and growth of the fetus. Testing is done if you have certain medical conditions or if there are problems during the pregnancy.  Nonstress test (NST). This test checks the health of your baby to make sure there are no signs of problems, such as the baby not getting enough oxygen. During this test, a belt is placed around your belly. The baby is made to move, and its heart rate is monitored during movement.  What is false labor? False labor is a condition in which you feel small, irregular tightenings of the muscles in the womb (contractions) that usually go away with rest, changing position, or drinking water. These are called Braxton Hicks contractions. Contractions may last for hours, days, or even weeks before true labor sets in. If contractions come at regular intervals, become more frequent, increase in intensity, or become painful, you should see your health care provider. What are the signs of labor?  Abdominal cramps.  Regular contractions that start at 10 minutes apart and become stronger and more frequent with time.  Contractions that start on the top of  the uterus and spread down to the lower abdomen and back.  Increased pelvic pressure and dull back pain.  A watery or bloody mucus discharge that comes from the vagina.  Leaking of amniotic fluid. This is also known as your "water breaking." It could be a slow trickle or a gush. Let your health care provider know if it has a color or strange odor. If you have any of these signs, call your health care provider right away, even if it is before your due date. Follow these instructions at home: Medicines  Follow your health care provider's instructions regarding medicine use. Specific medicines may be either safe or unsafe to take during pregnancy.  Take a prenatal vitamin that contains at least 600 micrograms (mcg) of folic acid.  If you develop constipation, try taking a stool softener if your health care provider approves. Eating and drinking  Eat a balanced diet that includes fresh fruits and vegetables, whole grains, good sources of protein such as meat, eggs, or tofu, and low-fat dairy. Your health care provider will help you determine the amount of weight gain that is right for you.  Avoid raw meat and uncooked cheese. These carry germs that  can cause birth defects in the baby.  If you have low calcium intake from food, talk to your health care provider about whether you should take a daily calcium supplement.  Eat four or five small meals rather than three large meals a day.  Limit foods that are high in fat and processed sugars, such as fried and sweet foods.  To prevent constipation: ? Drink enough fluid to keep your urine clear or pale yellow. ? Eat foods that are high in fiber, such as fresh fruits and vegetables, whole grains, and beans. Activity  Exercise only as directed by your health care provider. Most women can continue their usual exercise routine during pregnancy. Try to exercise for 30 minutes at least 5 days a week. Stop exercising if you experience uterine  contractions.  Avoid heavy lifting.  Do not exercise in extreme heat or humidity, or at high altitudes.  Wear low-heel, comfortable shoes.  Practice good posture.  You may continue to have sex unless your health care provider tells you otherwise. Relieving pain and discomfort  Take frequent breaks and rest with your legs elevated if you have leg cramps or low back pain.  Take warm sitz baths to soothe any pain or discomfort caused by hemorrhoids. Use hemorrhoid cream if your health care provider approves.  Wear a good support bra to prevent discomfort from breast tenderness.  If you develop varicose veins: ? Wear support pantyhose or compression stockings as told by your healthcare provider. ? Elevate your feet for 15 minutes, 3-4 times a day. Prenatal care  Write down your questions. Take them to your prenatal visits.  Keep all your prenatal visits as told by your health care provider. This is important. Safety  Wear your seat belt at all times when driving.  Make a list of emergency phone numbers, including numbers for family, friends, the hospital, and police and fire departments. General instructions  Avoid cat litter boxes and soil used by cats. These carry germs that can cause birth defects in the baby. If you have a cat, ask someone to clean the litter box for you.  Do not travel far distances unless it is absolutely necessary and only with the approval of your health care provider.  Do not use hot tubs, steam rooms, or saunas.  Do not drink alcohol.  Do not use any products that contain nicotine or tobacco, such as cigarettes and e-cigarettes. If you need help quitting, ask your health care provider.  Do not use any medicinal herbs or unprescribed drugs. These chemicals affect the formation and growth of the baby.  Do not douche or use tampons or scented sanitary pads.  Do not cross your legs for long periods of time.  To prepare for the arrival of your  baby: ? Take prenatal classes to understand, practice, and ask questions about labor and delivery. ? Make a trial run to the hospital. ? Visit the hospital and tour the maternity area. ? Arrange for maternity or paternity leave through employers. ? Arrange for family and friends to take care of pets while you are in the hospital. ? Purchase a rear-facing car seat and make sure you know how to install it in your car. ? Pack your hospital bag. ? Prepare the baby's nursery. Make sure to remove all pillows and stuffed animals from the baby's crib to prevent suffocation.  Visit your dentist if you have not gone during your pregnancy. Use a soft toothbrush to brush your teeth and be  gentle when you floss. Contact a health care provider if:  You are unsure if you are in labor or if your water has broken.  You become dizzy.  You have mild pelvic cramps, pelvic pressure, or nagging pain in your abdominal area.  You have lower back pain.  You have persistent nausea, vomiting, or diarrhea.  You have an unusual or bad smelling vaginal discharge.  You have pain when you urinate. Get help right away if:  Your water breaks before 37 weeks.  You have regular contractions less than 5 minutes apart before 37 weeks.  You have a fever.  You are leaking fluid from your vagina.  You have spotting or bleeding from your vagina.  You have severe abdominal pain or cramping.  You have rapid weight loss or weight gain.  You have shortness of breath with chest pain.  You notice sudden or extreme swelling of your face, hands, ankles, feet, or legs.  Your baby makes fewer than 10 movements in 2 hours.  You have severe headaches that do not go away when you take medicine.  You have vision changes. Summary  The third trimester is from week 28 through week 40, months 7 through 9. The third trimester is a time when the unborn baby (fetus) is growing rapidly.  During the third trimester, your  discomfort may increase as you and your baby continue to gain weight. You may have abdominal, leg, and back pain, sleeping problems, and an increased need to urinate.  During the third trimester your breasts will keep growing and they will continue to become tender. A yellow fluid (colostrum) may leak from your breasts. This is the first milk you are producing for your baby.  False labor is a condition in which you feel small, irregular tightenings of the muscles in the womb (contractions) that eventually go away. These are called Braxton Hicks contractions. Contractions may last for hours, days, or even weeks before true labor sets in.  Signs of labor can include: abdominal cramps; regular contractions that start at 10 minutes apart and become stronger and more frequent with time; watery or bloody mucus discharge that comes from the vagina; increased pelvic pressure and dull back pain; and leaking of amniotic fluid. This information is not intended to replace advice given to you by your health care provider. Make sure you discuss any questions you have with your health care provider. Document Released: 07/11/2001 Document Revised: 12/23/2015 Document Reviewed: 09/17/2012 Elsevier Interactive Patient Education  2017 [employer].

## 2017-05-07 NOTE — Addendum Note (Signed)
Addended by: Lorelle Gibbs L on: 05/07/2017 10:28 AM   Modules accepted: Orders

## 2017-05-08 LAB — CBC
HEMOGLOBIN: 11 g/dL — AB (ref 11.1–15.9)
Hematocrit: 32.6 % — ABNORMAL LOW (ref 34.0–46.6)
MCH: 30.6 pg (ref 26.6–33.0)
MCHC: 33.7 g/dL (ref 31.5–35.7)
MCV: 91 fL (ref 79–97)
PLATELETS: 239 10*3/uL (ref 150–379)
RBC: 3.59 x10E6/uL — AB (ref 3.77–5.28)
RDW: 13.8 % (ref 12.3–15.4)
WBC: 7.1 10*3/uL (ref 3.4–10.8)

## 2017-05-08 LAB — GLUCOSE TOLERANCE, 2 HOURS W/ 1HR
Glucose, 1 hour: 147 mg/dL (ref 65–179)
Glucose, 2 hour: 114 mg/dL (ref 65–152)
Glucose, Fasting: 82 mg/dL (ref 65–91)

## 2017-05-08 LAB — RPR: RPR Ser Ql: NONREACTIVE

## 2017-05-08 LAB — HIV ANTIBODY (ROUTINE TESTING W REFLEX): HIV SCREEN 4TH GENERATION: NONREACTIVE

## 2017-05-08 LAB — GC/CHLAMYDIA PROBE AMP (~~LOC~~) NOT AT ARMC
CHLAMYDIA, DNA PROBE: POSITIVE — AB
Neisseria Gonorrhea: NEGATIVE

## 2017-05-09 ENCOUNTER — Telehealth: Payer: Self-pay | Admitting: *Deleted

## 2017-05-09 DIAGNOSIS — A749 Chlamydial infection, unspecified: Secondary | ICD-10-CM

## 2017-05-09 MED ORDER — AZITHROMYCIN 250 MG PO TABS: 1000.0000 mg | ORAL_TABLET | Freq: Once | ORAL | 0 refills | Status: DC

## 2017-05-09 NOTE — Telephone Encounter (Signed)
Contacted patient re: she is still positive for chlamydia. Patient stated she did take her initial prescription of antibiotics and that her partner said he had been treated. I reinforced that both she and her partner should be retreated and abstain from intercourse until both have waited 2 weeks from treatment. Rx to pharmacy. Patient voiced understanding.

## 2017-05-10 ENCOUNTER — Other Ambulatory Visit: Payer: Self-pay | Admitting: Advanced Practice Midwife

## 2017-05-10 DIAGNOSIS — A749 Chlamydial infection, unspecified: Secondary | ICD-10-CM

## 2017-05-14 ENCOUNTER — Inpatient Hospital Stay (HOSPITAL_COMMUNITY)
Admission: AD | Admit: 2017-05-14 | Discharge: 2017-05-14 | Disposition: A | Discharge status: Home / Self Care | Payer: Medicaid Other | Source: Ambulatory Visit | Attending: Family Medicine | Admitting: Family Medicine

## 2017-05-14 ENCOUNTER — Encounter (HOSPITAL_COMMUNITY): Payer: Self-pay | Admitting: *Deleted

## 2017-05-14 DIAGNOSIS — R109 Unspecified abdominal pain: Secondary | ICD-10-CM | POA: Insufficient documentation

## 2017-05-14 DIAGNOSIS — O219 Vomiting of pregnancy, unspecified: Secondary | ICD-10-CM

## 2017-05-14 DIAGNOSIS — O26893 Other specified pregnancy related conditions, third trimester: Secondary | ICD-10-CM | POA: Insufficient documentation

## 2017-05-14 DIAGNOSIS — Z3A31 31 weeks gestation of pregnancy: Secondary | ICD-10-CM | POA: Diagnosis not present

## 2017-05-14 DIAGNOSIS — O212 Late vomiting of pregnancy: Secondary | ICD-10-CM | POA: Diagnosis not present

## 2017-05-14 DIAGNOSIS — O479 False labor, unspecified: Secondary | ICD-10-CM | POA: Diagnosis not present

## 2017-05-14 LAB — URINALYSIS, ROUTINE W REFLEX MICROSCOPIC
BILIRUBIN URINE: NEGATIVE
Glucose, UA: NEGATIVE mg/dL
HGB URINE DIPSTICK: NEGATIVE
Ketones, ur: NEGATIVE mg/dL
NITRITE: NEGATIVE
PH: 6 (ref 5.0–8.0)
Protein, ur: NEGATIVE mg/dL
SPECIFIC GRAVITY, URINE: 1.019 (ref 1.005–1.030)

## 2017-05-14 MED ORDER — ONDANSETRON HCL 4 MG PO TABS: 4.0000 mg | ORAL_TABLET | Freq: Three times a day (TID) | ORAL | 0 refills | Status: DC | PRN

## 2017-05-14 NOTE — MAU Note (Signed)
+   abdominal pain States its all over Started around 9am Rating pain 9/10 Cramping in nature intermittent  +nausea Emesis x 4 States unable to keep anything down (had pancakes, egg, and sausage) Started this am  Denies diarrhea   +FM Denies LOF or VB

## 2017-05-18 ENCOUNTER — Encounter: Payer: Self-pay | Admitting: *Deleted

## 2017-05-21 ENCOUNTER — Ambulatory Visit (INDEPENDENT_AMBULATORY_CARE_PROVIDER_SITE_OTHER): Payer: Medicaid Other | Admitting: Advanced Practice Midwife

## 2017-05-21 VITALS — BP 116/71 | HR 104 | Wt 152.0 lb

## 2017-05-21 DIAGNOSIS — Z3480 Encounter for supervision of other normal pregnancy, unspecified trimester: Secondary | ICD-10-CM

## 2017-05-21 DIAGNOSIS — Z3483 Encounter for supervision of other normal pregnancy, third trimester: Secondary | ICD-10-CM

## 2017-05-21 DIAGNOSIS — O98813 Other maternal infectious and parasitic diseases complicating pregnancy, third trimester: Secondary | ICD-10-CM

## 2017-05-21 DIAGNOSIS — A749 Chlamydial infection, unspecified: Secondary | ICD-10-CM

## 2017-05-21 DIAGNOSIS — O98819 Other maternal infectious and parasitic diseases complicating pregnancy, unspecified trimester: Secondary | ICD-10-CM

## 2017-05-21 NOTE — Progress Notes (Signed)
   PRENATAL VISIT NOTE  Subjective:  Kathryn Silva is a 21 y.o. G2P0010 at 1052w6d being seen today for ongoing prenatal care.  She is currently monitored for the following issues for this low-risk pregnancy and has History of first trimester miscarriage, currently pregnant and Supervision of other normal pregnancy, antepartum on her problem list.  Patient reports no complaints.  Contractions: Not present. Vag. Bleeding: None.  Movement: Present. Denies leaking of fluid.   The following portions of the patient's history were reviewed and updated as appropriate: allergies, current medications, past family history, past medical history, past social history, past surgical history and problem list. Problem list updated.  Objective:   Vitals:   05/21/17 1406  BP: 116/71  Pulse: (!) 104  Weight: 152 lb (68.9 kg)    Fetal Status: Fetal Heart Rate (bpm): 152   Movement: Present     General:  Alert, oriented and cooperative. Patient is in no acute distress.  Skin: Skin is warm and dry. No rash noted.   Cardiovascular: Normal heart rate noted  Respiratory: Normal respiratory effort, no problems with respiration noted  Abdomen: Soft, gravid, appropriate for gestational age.  Pain/Pressure: Present     Pelvic: Cervical exam deferred        Extremities: Normal range of motion.  Edema: None  Mental Status:  Normal mood and affect. Normal behavior. Normal judgment and thought content.   Assessment and Plan:  Pregnancy: G2P0010 at 4952w6d  1. Supervision of other normal pregnancy, antepartum  2. Chlamydia infection affecting pregnancy, antepartum TOC next visit  Preterm labor symptoms and general obstetric precautions including but not limited to vaginal bleeding, contractions, leaking of fluid and fetal movement were reviewed in detail with the patient. Please refer to After Visit Summary for other counseling recommendations.  Return in about 2 weeks (around 06/04/2017) for ROB.   Rolm BookbinderCaroline M  Neill, CNM  05/21/17 2:24 PM

## 2017-05-21 NOTE — Patient Instructions (Addendum)
Fetal Movement Counts Patient Name: ________________________________________________ Patient Due Date: ____________________ What is a fetal movement count? A fetal movement count is the number of times that you feel your baby move during a certain amount of time. This may also be called a fetal kick count. A fetal movement count is recommended for every pregnant woman. You may be asked to start counting fetal movements as early as week 28 of your pregnancy. Pay attention to when your baby is most active. You may notice your baby's sleep and wake cycles. You may also notice things that make your baby move more. You should do a fetal movement count:  When your baby is normally most active.  At the same time each day.  A good time to count movements is while you are resting, after having something to eat and drink. How do I count fetal movements? 1. Find a quiet, comfortable area. Sit, or lie down on your side. 2. Write down the date, the start time and stop time, and the number of movements that you felt between those two times. Take this information with you to your health care visits. 3. For 2 hours, count kicks, flutters, swishes, rolls, and jabs. You should feel at least 10 movements during 2 hours. 4. You may stop counting after you have felt 10 movements. 5. If you do not feel 10 movements in 2 hours, have something to eat and drink. Then, keep resting and counting for 1 hour. If you feel at least 4 movements during that hour, you may stop counting. Contact a health care provider if:  You feel fewer than 4 movements in 2 hours.  Your baby is not moving like he or she usually does. Date: ____________ Start time: ____________ Stop time: ____________ Movements: ____________ Date: ____________ Start time: ____________ Stop time: ____________ Movements: ____________ Date: ____________ Start time: ____________ Stop time: ____________ Movements: ____________ Date: ____________ Start time:  ____________ Stop time: ____________ Movements: ____________ Date: ____________ Start time: ____________ Stop time: ____________ Movements: ____________ Date: ____________ Start time: ____________ Stop time: ____________ Movements: ____________ Date: ____________ Start time: ____________ Stop time: ____________ Movements: ____________ Date: ____________ Start time: ____________ Stop time: ____________ Movements: ____________ Date: ____________ Start time: ____________ Stop time: ____________ Movements: ____________ This information is not intended to replace advice given to you by your health care provider. Make sure you discuss any questions you have with your health care provider. Document Released: 08/16/2006 Document Revised: 03/15/2016 Document Reviewed: 08/26/2015 Elsevier Interactive Patient Education  2018 [employer]. [employer] of the uterus can occur throughout pregnancy, but they are not always a sign that you are in labor. You may have practice contractions called Braxton Hicks contractions. These false labor contractions are sometimes confused with true labor. What are [place] contractions? Braxton Hicks contractions are tightening movements that occur in the muscles of the uterus before labor. Unlike true labor contractions, these contractions do not result in opening (dilation) and thinning of the cervix. Toward the end of pregnancy (32-34 weeks), Braxton Hicks contractions can happen more often and may become stronger. These contractions are sometimes difficult to tell apart from true labor because they can be very uncomfortable. You should not feel embarrassed if you go to the hospital with false labor. Sometimes, the only way to tell if you are in true labor is for your health care provider to look for changes in the cervix. The health care provider will do a physical exam and may monitor your contractions. If  you are not in true labor, the exam  should show that your cervix is not dilating and your water has not broken. If there are no prenatal problems or other health problems associated with your pregnancy, it is completely safe for you to be sent home with false labor. You may continue to have Braxton Hicks contractions until you go into true labor. How can I tell the difference between true labor and false labor?  Differences ? False labor ? Contractions last 30-70 seconds.: Contractions are usually shorter and not as strong as true labor contractions. ? Contractions become very regular.: Contractions are usually irregular. ? Discomfort is usually felt in the top of the uterus, and it spreads to the lower abdomen and low back.: Contractions are often felt in the front of the lower abdomen and in the groin. ? Contractions do not go away with walking.: Contractions may go away when you walk around or change positions while lying down. ? Contractions usually become more intense and increase in frequency.: Contractions get weaker and are shorter-lasting as time goes on. ? The cervix dilates and gets thinner.: The cervix usually does not dilate or become thin. Follow these instructions at home:  Take over-the-counter and prescription medicines only as told by your health care provider.  Keep up with your usual exercises and follow other instructions from your health care provider.  Eat and drink lightly if you think you are going into labor.  If Braxton Hicks contractions are making you uncomfortable: ? Change your position from lying down or resting to walking, or change from walking to resting. ? Sit and rest in a tub of warm water. ? Drink enough fluid to keep your urine clear or pale yellow. Dehydration may cause these contractions. ? Do slow and deep breathing several times an hour.  Keep all follow-up prenatal visits as told by your health care provider. This is important. Contact a health care provider if:  You have a  fever.  You have continuous pain in your abdomen. Get help right away if:  Your contractions become stronger, more regular, and closer together.  You have fluid leaking or gushing from your vagina.  You pass blood-tinged mucus (bloody show).  You have bleeding from your vagina.  You have low back pain that you never had before.  You feel your baby's head pushing down and causing pelvic pressure.  Your baby is not moving inside you as much as it used to. Summary  Contractions that occur before labor are called Braxton Hicks contractions, false labor, or practice contractions.  Braxton Hicks contractions are usually shorter, weaker, farther apart, and less regular than true labor contractions. True labor contractions usually become progressively stronger and regular and they become more frequent.  Manage discomfort from [facility] contractions by changing position, resting in a warm bath, drinking plenty of water, or practicing deep breathing. This information is not intended to replace advice given to you by your health care provider. Make sure you discuss any questions you have with your health care provider. Document Released: 07/17/2005 Document Revised: 06/05/2016 Document Reviewed: 06/05/2016 Elsevier Interactive Patient Education  2017 Elsevier [employer].  AREA PEDIATRIC/FAMILY PRACTICE PHYSICIANS  [facility] [address] [place], [place]  [address] Phone - [phone]   Fax - [phone]  [facility] [address] [place], [place] [address] Phone - [phone]   Fax - [phone]  JACK AMOS [address] [place],  [place]  [address] Phone - [phone]   Fax - [phone]  [facility] [address] [place], [place]  [address] Phone - [phone]   Fax - [phone]  [facility] [address] [place], [place]  [address] Phone - [phone]   Fax -  [phone]  [facility] [address], [address] [place], [place]  [address] Phone - [phone]   Fax - [phone]  [facility] [address] [place], [place]  [address] Phone - [phone]   Fax - [phone]  [facility] [address] [place], [place]  [address] Phone - [phone]   Fax - [phone]  [facility] [address] [place], [place]  [address] Phone - [phone]   Fax - [phone] [facility] [address] [place], [place]  [address] Phone - [phone]   Fax - [phone]  [facility] [address] [place], [place]  [address] Phone - [phone]   Fax - [phone]  [facility] [address], [address] [place], [place]  [address] Phone - [phone]   Fax - [phone]  [facility] [address] [place], [place]  [address] Phone - [phone]   Fax - [phone]  [facility] [address], [address] [place] [place], [place]  [address] Phone - [phone]  [facility] [address] [place], [place]  [address] Phone - [phone]   Fax - [phone]  [facility] [address], [address] [place], [place]  [address] Phone - [phone]   Fax - [phone]  [facility] [address] [place], [place]  [address] Phone - [phone]   Fax - [phone]  [facility] [address] [place], [place]  [address] Phone - [phone]   Fax - [phone]   [facility] [address], [address] [place], [place]  [address] Phone - [phone]   Fax - [phone]  [facility] [address], [address] [place], [place]  [address] Phone - [phone]   Fax - [phone]  DAVID RUBIN [address], [address] [place], [place]  [address] Phone - [phone]   Fax - [phone]  [facility] [address] [place], [place]  [address] Phone - [phone]   Fax - [phone]  [facility] [address] [place], [place]  [address] Phone - [phone]   Fax - [phone] [facility] [place] [address] [place], [place]  [address] Phone - [phone]   Fax - [phone]  [facility] [address] [place], [place]  [address] Phone - [phone]   Fax - [phone]  [facility] [place] [address] [place], [place]  [address] Phone - [phone]   Fax - [phone]  [facility] [place] Wyvonne Lenzharlene Flemming MD [address] [place] [address] Phone [phone]  Fax [phone]

## 2017-06-04 ENCOUNTER — Ambulatory Visit (INDEPENDENT_AMBULATORY_CARE_PROVIDER_SITE_OTHER): Payer: Medicaid Other | Admitting: Advanced Practice Midwife

## 2017-06-04 ENCOUNTER — Other Ambulatory Visit (HOSPITAL_COMMUNITY)
Admission: RE | Admit: 2017-06-04 | Discharge: 2017-06-04 | Disposition: A | Discharge status: Home / Self Care | Payer: Medicaid Other | Source: Ambulatory Visit | Attending: Advanced Practice Midwife | Admitting: Advanced Practice Midwife

## 2017-06-04 VITALS — BP 128/70 | HR 111 | Wt 151.9 lb

## 2017-06-04 DIAGNOSIS — Z202 Contact with and (suspected) exposure to infections with a predominantly sexual mode of transmission: Secondary | ICD-10-CM | POA: Diagnosis not present

## 2017-06-04 DIAGNOSIS — Z3480 Encounter for supervision of other normal pregnancy, unspecified trimester: Secondary | ICD-10-CM

## 2017-06-04 NOTE — Progress Notes (Signed)
   PRENATAL VISIT NOTE  Subjective:  Kathryn Silva is a 21 y.o. G2P0010 at 6273w6d being seen today for ongoing prenatal care.  She is currently monitored for the following issues for this low-risk pregnancy and has History of first trimester miscarriage, currently pregnant and Supervision of other normal pregnancy, antepartum on their problem list.  Patient reports no complaints.  Contractions: Not present. Vag. Bleeding: None.  Movement: Present. Denies leaking of fluid.   The following portions of the patient's history were reviewed and updated as appropriate: allergies, current medications, past family history, past medical history, past social history, past surgical history and problem list. Problem list updated.  Objective:   Vitals:   06/04/17 1314  BP: 128/70  Pulse: (!) 111  Weight: 151 lb 14.4 oz (68.9 kg)    Fetal Status: Fetal Heart Rate (bpm): 150   Movement: Present     General:  Alert, oriented and cooperative. Patient is in no acute distress.  Skin: Skin is warm and dry. No rash noted.   Cardiovascular: Normal heart rate noted  Respiratory: Normal respiratory effort, no problems with respiration noted  Abdomen: Soft, gravid, appropriate for gestational age.  Pain/Pressure: Present     Pelvic: Cervical exam deferred        Extremities: Normal range of motion.  Edema: None  Mental Status:  Normal mood and affect. Normal behavior. Normal judgment and thought content.   Assessment and Plan:  Pregnancy: G2P0010 at 1873w6d  1. STD exposure -TOC today - GC[facility]Chlam[facility] probe amp [facility]  2. Supervision of other normal pregnancy, antepartum -Braxton Hicks contractions and fetal kick counts reviewed  Preterm labor symptoms and general obstetric precautions including but not limited to vaginal bleeding, contractions, leaking of fluid and fetal movement were reviewed in detail with the patient. Please refer to After Visit Summary for other counseling  recommendations.  Return in about 2 weeks (around 06/18/2017) for ROB.   Rolm BookbinderCaroline M Neill, CNM 06/04/17 1:57 PM

## 2017-06-04 NOTE — Patient Instructions (Signed)
Fetal Movement Counts °Patient Name: ________________________________________________ Patient Due Date: ____________________ °What is a fetal movement count? °A fetal movement count is the number of times that you feel your baby move during a certain amount of time. This may also be called a fetal kick count. A fetal movement count is recommended for every pregnant woman. You may be asked to start counting fetal movements as early as week 28 of your pregnancy. °Pay attention to when your baby is most active. You may notice your baby's sleep and wake cycles. You may also notice things that make your baby move more. You should do a fetal movement count: °· When your baby is normally most active. °· At the same time each day. ° °A good time to count movements is while you are resting, after having something to eat and drink. °How do I count fetal movements? °1. Find a quiet, comfortable area. Sit, or lie down on your side. °2. Write down the date, the start time and stop time, and the number of movements that you felt between those two times. Take this information with you to your health care visits. °3. For 2 hours, count kicks, flutters, swishes, rolls, and jabs. You should feel at least 10 movements during 2 hours. °4. You may stop counting after you have felt 10 movements. °5. If you do not feel 10 movements in 2 hours, have something to eat and drink. Then, keep resting and counting for 1 hour. If you feel at least 4 movements during that hour, you may stop counting. °Contact a health care provider if: °· You feel fewer than 4 movements in 2 hours. °· Your baby is not moving like he or she usually does. °Date: ____________ Start time: ____________ Stop time: ____________ Movements: ____________ °Date: ____________ Start time: ____________ Stop time: ____________ Movements: ____________ °Date: ____________ Start time: ____________ Stop time: ____________ Movements: ____________ °Date: ____________ Start time:  ____________ Stop time: ____________ Movements: ____________ °Date: ____________ Start time: ____________ Stop time: ____________ Movements: ____________ °Date: ____________ Start time: ____________ Stop time: ____________ Movements: ____________ °Date: ____________ Start time: ____________ Stop time: ____________ Movements: ____________ °Date: ____________ Start time: ____________ Stop time: ____________ Movements: ____________ °Date: ____________ Start time: ____________ Stop time: ____________ Movements: ____________ °This information is not intended to replace advice given to you by your health care provider. Make sure you discuss any questions you have with your health care provider. °Document Released: 08/16/2006 Document Revised: 03/15/2016 Document Reviewed: 08/26/2015 °Elsevier Interactive Patient Education © 2018 Elsevier [employer] °Braxton Hicks Contractions °Contractions of the uterus can occur throughout pregnancy, but they are not always a sign that you are in labor. You may have practice contractions called Braxton Hicks contractions. These false labor contractions are sometimes confused with true labor. °What are Braxton Hicks contractions? °Braxton Hicks contractions are tightening movements that occur in the muscles of the uterus before labor. Unlike true labor contractions, these contractions do not result in opening (dilation) and thinning of the cervix. Toward the end of pregnancy (32-34 weeks), Braxton Hicks contractions can happen more often and may become stronger. These contractions are sometimes difficult to tell apart from true labor because they can be very uncomfortable. You should not feel embarrassed if you go to the hospital with false labor. °Sometimes, the only way to tell if you are in true labor is for your health care provider to look for changes in the cervix. The health care provider will do a physical exam and may monitor your contractions. If   you are not in true labor, the exam  should show that your cervix is not dilating and your water has not broken. °If there are no prenatal problems or other health problems associated with your pregnancy, it is completely safe for you to be sent home with false labor. You may continue to have Braxton Hicks contractions until you go into true labor. °How can I tell the difference between true labor and false labor? °· Differences °? False labor °? Contractions last 30-70 seconds.: Contractions are usually shorter and not as strong as true labor contractions. °? Contractions become very regular.: Contractions are usually irregular. °? Discomfort is usually felt in the top of the uterus, and it spreads to the lower abdomen and low back.: Contractions are often felt in the front of the lower abdomen and in the groin. °? Contractions do not go away with walking.: Contractions may go away when you walk around or change positions while lying down. °? Contractions usually become more intense and increase in frequency.: Contractions get weaker and are shorter-lasting as time goes on. °? The cervix dilates and gets thinner.: The cervix usually does not dilate or become thin. °Follow these instructions at home: °· Take over-the-counter and prescription medicines only as told by your health care provider. °· Keep up with your usual exercises and follow other instructions from your health care provider. °· Eat and drink lightly if you think you are going into labor. °· If Braxton Hicks contractions are making you uncomfortable: °? Change your position from lying down or resting to walking, or change from walking to resting. °? Sit and rest in a tub of warm water. °? Drink enough fluid to keep your urine clear or pale yellow. Dehydration may cause these contractions. °? Do slow and deep breathing several times an hour. °· Keep all follow-up prenatal visits as told by your health care provider. This is important. °Contact a health care provider if: °· You have a  fever. °· You have continuous pain in your abdomen. °Get help right away if: °· Your contractions become stronger, more regular, and closer together. °· You have fluid leaking or gushing from your vagina. °· You pass blood-tinged mucus (bloody show). °· You have bleeding from your vagina. °· You have low back pain that you never had before. °· You feel your baby’s head pushing down and causing pelvic pressure. °· Your baby is not moving inside you as much as it used to. °Summary °· Contractions that occur before labor are called Braxton Hicks contractions, false labor, or practice contractions. °· Braxton Hicks contractions are usually shorter, weaker, farther apart, and less regular than true labor contractions. True labor contractions usually become progressively stronger and regular and they become more frequent. °· Manage discomfort from Braxton Hicks contractions by changing position, resting in a warm bath, drinking plenty of water, or practicing deep breathing. °This information is not intended to replace advice given to you by your health care provider. Make sure you discuss any questions you have with your health care provider. °Document Released: 07/17/2005 Document Revised: 06/05/2016 Document Reviewed: 06/05/2016 °Elsevier Interactive Patient Education © 2017 Elsevier Inc. ° °

## 2017-06-04 NOTE — Progress Notes (Signed)
[facility] pt [facility]

## 2017-06-05 LAB — GC/CHLAMYDIA PROBE AMP (~~LOC~~) NOT AT ARMC
CHLAMYDIA, DNA PROBE: POSITIVE — AB
NEISSERIA GONORRHEA: NEGATIVE

## 2017-06-09 ENCOUNTER — Other Ambulatory Visit: Payer: Self-pay | Admitting: Advanced Practice Midwife

## 2017-06-09 MED ORDER — AZITHROMYCIN 250 MG PO TABS: 1000.0000 mg | ORAL_TABLET | Freq: Once | ORAL | 0 refills | Status: AC

## 2017-06-09 NOTE — Progress Notes (Signed)
+  Clamydia. Azithromycin 1g sent to pharmacy on file. Kathryn Silva 10:56 AM 06/09/17

## 2017-06-12 ENCOUNTER — Telehealth: Payer: Self-pay | Admitting: General Practice

## 2017-06-12 DIAGNOSIS — A749 Chlamydial infection, unspecified: Secondary | ICD-10-CM

## 2017-06-12 MED ORDER — AZITHROMYCIN 250 MG PO TABS: 1000.0000 mg | ORAL_TABLET | Freq: Once | ORAL | 0 refills | Status: AC

## 2017-06-12 NOTE — Telephone Encounter (Signed)
-----   Message from Armando ReichertHeather D Hogan, CNM sent at 06/09/2017 10:55 AM EST ----- + Chlamydia, please call patient, and let her know that Azithromycin was sent to pharmacy on file.

## 2017-06-12 NOTE — Telephone Encounter (Signed)
Called patient & informed her of + CT & medication sent to pharmacy. Discussed importance of partner treatment and abstaining from intercourse for 2 weeks following treatment. Patient verbalized understanding & had no questions. STD card completed.

## 2017-06-18 ENCOUNTER — Ambulatory Visit (INDEPENDENT_AMBULATORY_CARE_PROVIDER_SITE_OTHER): Payer: Medicaid Other | Admitting: Advanced Practice Midwife

## 2017-06-18 VITALS — BP 119/69 | HR 96 | Wt 154.2 lb

## 2017-06-18 DIAGNOSIS — Z029 Encounter for administrative examinations, unspecified: Secondary | ICD-10-CM

## 2017-06-18 DIAGNOSIS — Z3480 Encounter for supervision of other normal pregnancy, unspecified trimester: Secondary | ICD-10-CM

## 2017-06-18 DIAGNOSIS — Z3483 Encounter for supervision of other normal pregnancy, third trimester: Secondary | ICD-10-CM

## 2017-06-18 DIAGNOSIS — A749 Chlamydial infection, unspecified: Secondary | ICD-10-CM

## 2017-06-18 NOTE — Progress Notes (Signed)
   PRENATAL VISIT NOTE  Subjective:  Kathryn Silva is a 21 y.o. G2P0010 at [redacted]w[redacted]d being seen today for ongoing prenatal care.  She is currently monitored for the following issues for this low-risk pregnancy and has History of first trimester miscarriage, currently pregnant and Supervision of other normal pregnancy, antepartum on their problem list.  Patient reports no complaints.  Contractions: Not present. Vag. Bleeding: None.  Movement: Present. Denies leaking of fluid.   The following portions of the patient's history were reviewed and updated as appropriate: allergies, current medications, past family history, past medical history, past social history, past surgical history and problem list. Problem list updated.  Objective:   Vitals:   06/18/17 1503  BP: 119/69  Pulse: 96  Weight: 154 lb 3.2 oz (69.9 kg)    Fetal Status: Fetal Heart Rate (bpm): 146 Fundal Height: 35 cm Movement: Present     General:  Alert, oriented and cooperative. Patient is in no acute distress.  Skin: Skin is warm and dry. No rash noted.   Cardiovascular: Normal heart rate noted  Respiratory: Normal respiratory effort, no problems with respiration noted  Abdomen: Soft, gravid, appropriate for gestational age.  Pain/Pressure: Absent     Pelvic: Cervical exam deferred        Extremities: Normal range of motion.  Edema: Trace  Mental Status:  Normal mood and affect. Normal behavior. Normal judgment and thought content.   Assessment and Plan:  Pregnancy: G2P0010 at 6612w6d  1. Chlamydia - TOC at next visit 2. Supervision of other normal pregnancy, antepartum - GBS at next visit   Preterm labor symptoms and general obstetric precautions including but not limited to vaginal bleeding, contractions, leaking of fluid and fetal movement were reviewed in detail with the patient. Please refer to After Visit Summary for other counseling recommendations.  Return in about 2 weeks (around 07/02/2017).   Thressa ShellerHeather Hogan,  CNM

## 2017-06-18 NOTE — Patient Instructions (Addendum)
Places to have your son circumcised:    [facility] [phone] while you are in hospital  [facility] [phone] $244 by 4 wks  [facility] [phone] $175 by 2 wks  [facility] [phone] $250 by 7 days [facility] [phone] $150 by 4 wks  These prices sometimes change but are roughly what you can expect to pay. Please call and confirm pricing.   Circumcision is considered an elective/non-medically necessary procedure. There are many reasons parents decide to have their sons circumsized. During the first year of life circumcised males have a reduced risk of urinary tract infections but after this year the rates between circumcised males and uncircumcised males are the same.  It is safe to have your son circumcised outside of the hospital and the places above perform them regularly.  [facility] [place] [address], [address] [place], [place]  [address] Phone - [phone]   Fax - [phone]  [facility] [address] [place], [place] [address] Phone - [phone]   Fax - [place] [address] [place], [place]  [address] Phone - [phone]   Fax - [phone]  [place] [address] [place] [address] [place], [place]  [address] Phone - [phone]   Fax - [phone]  [facility] [address] [place], [place]  [address] Phone - [phone]   Fax - [phone]  [facility] [address] [place], [place]  [address] Phone - [phone]   Fax - [place] [address] [place], [place]  [address] Phone - [phone]   Fax - [phone]  [place] [address] [place], [place]  [address] [place], [place]  [address] Phone - [phone]   Fax - [place] [address] [place], [place]  [address] Phone - [phone]   Fax - [phone] [facility] [place]. [address] [place], [place]  [address] Phone - [phone]   Fax - [phone]  [facility] [place] [address] [place], [place]  [address] Phone - [phone]   Fax - [phone]  [facility] AT [place], [place], [place]  [address] Phone - [phone]   Fax - [place] [address], [place], [place]  [address] Phone - [phone]   Fax - [phone]  [facility] [address] [place], [place]  [address] Phone - [place], [place]  [address] Phone - [phone]   Fax - [place] [address], [place], [place]  [address] Phone - [phone]   Fax - [phone]  [place] [address] [place], [place]  [address] Phone - [phone]   Fax - [place]. [place], [place]  [address] Phone - [phone]   Fax - [place], [place], [place]  [address] Phone - [phone]   Fax - [place] [address], [place], [place]  [address] Phone - [phone]   Fax - [phone]  [facility] [address] [place], [place]  [address] Phone - [phone]   Fax - [place]  [address] [place], [place]  [address] Phone - [phone]   Fax - [phone]  [facility] [place] [address] [place], [place]  [address] Phone - [phone]   Fax - [phone] [facility] [place] [id number] [place], [place]  [address] Phone - [phone]   Fax - [phone]  [facility] [place] [address] [place], [place]  [address] Phone - [phone]   Fax - [phone]  [facility] [place] [address] [place], [place]  [address] Phone - [phone]   Fax - [phone]  [facility] [place] Wyvonne Lenzharlene Flemming MD [address] [place] [address] Phone [phone]  Fax [phone]  Doula Services   Natural Baby Doulas [email or web address] Tel: [phone] [email or web address]  [facility] [phone] [email or web address]  [facility] Merla RichesLadies  (also do waterbirth tub rental) [phone] [email or web address]  [facility] [phone] [email or web address]  [facility]  [phone] [email or web address]  [employer] [employer] (PADA) [email or web address]  [facility]  [email or web address]  Etonogestrel implant What is this medicine? ETONOGESTREL (et oh noe JES trel) is a contraceptive (birth control) device. It is used to prevent pregnancy. It can be used for up to 3 years. This medicine may be used for other purposes; ask your health care provider or pharmacist if you have questions. COMMON BRAND NAME(S): Implanon, Nexplanon What should I tell my health care provider before I take this medicine? They need to know if you have any of these conditions: -abnormal vaginal bleeding -blood vessel disease or blood clots -cancer of the breast, cervix, or liver -depression -diabetes -gallbladder disease -headaches -heart disease or recent heart attack -high blood pressure -high cholesterol -kidney disease -liver disease -renal disease -seizures -tobacco smoker -an  unusual or allergic reaction to etonogestrel, other hormones, anesthetics or antiseptics, medicines, foods, dyes, or preservatives -pregnant or trying to get pregnant -breast-feeding How should I use this medicine? This device is inserted just under the skin on the inner side of your upper arm by a health care professional. Talk to your pediatrician regarding the use of this medicine in children. Special care may be needed. Overdosage: If you think you have taken too much of this medicine contact a poison control center or emergency room at once. NOTE: This medicine is only for you. Do not share this medicine with others. What if I miss a dose? This does not apply. What may interact with this medicine? Do not take this medicine with any of the following medications: -amprenavir -bosentan -fosamprenavir This medicine may also interact with the following medications: -barbiturate medicines for inducing sleep or treating seizures -certain medicines for fungal infections like ketoconazole and itraconazole -grapefruit juice -griseofulvin -medicines to treat seizures like carbamazepine, felbamate, oxcarbazepine, phenytoin, topiramate -modafinil -phenylbutazone -rifampin -rufinamide -some medicines to treat HIV infection like atazanavir, indinavir, lopinavir, nelfinavir, tipranavir, ritonavir -St. John's wort This list may not describe all possible interactions. Give your health care provider a list of all the medicines, herbs, non-prescription drugs, or dietary supplements you use. Also tell them if you smoke, drink alcohol, or use illegal drugs. Some items may interact with your medicine. What should I watch for while using this medicine? This product does not protect you against HIV infection (AIDS) or other sexually transmitted diseases. You should be able to feel the implant by pressing your fingertips over the skin where it was inserted. Contact your doctor if you cannot feel the  implant, and use  a non-hormonal birth control method (such as condoms) until your doctor confirms that the implant is in place. If you feel that the implant may have broken or become bent while in your arm, contact your healthcare provider. What side effects may I notice from receiving this medicine? Side effects that you should report to your doctor or health care professional as soon as possible: -allergic reactions like skin rash, itching or hives, swelling of the face, lips, or tongue -breast lumps -changes in emotions or moods -depressed mood -heavy or prolonged menstrual bleeding -pain, irritation, swelling, or bruising at the insertion site -scar at site of insertion -signs of infection at the insertion site such as fever, and skin redness, pain or discharge -signs of pregnancy -signs and symptoms of a blood clot such as breathing problems; changes in vision; chest pain; severe, sudden headache; pain, swelling, warmth in the leg; trouble speaking; sudden numbness or weakness of the face, arm or leg -signs and symptoms of liver injury like dark yellow or brown urine; general ill feeling or flu-like symptoms; light-colored stools; loss of appetite; nausea; right upper belly pain; unusually weak or tired; yellowing of the eyes or skin -unusual vaginal bleeding, discharge -signs and symptoms of a stroke like changes in vision; confusion; trouble speaking or understanding; severe headaches; sudden numbness or weakness of the face, arm or leg; trouble walking; dizziness; loss of balance or coordination Side effects that usually do not require medical attention (report to your doctor or health care professional if they continue or are bothersome): -acne -back pain -breast pain -changes in weight -dizziness -general ill feeling or flu-like symptoms -headache -irregular menstrual bleeding -nausea -sore throat -vaginal irritation or inflammation This list may not describe all possible side  effects. Call your doctor for medical advice about side effects. You may report side effects to FDA at 1-800-FDA-1088. Where should I keep my medicine? This drug is given in a hospital or clinic and will not be stored at home. NOTE: This sheet is a summary. It may not cover all possible information. If you have questions about this medicine, talk to your doctor, pharmacist, or health care provider.  2018 Elsevier/Gold Standard (2016-02-03 11:19:22)

## 2017-07-02 ENCOUNTER — Encounter: Payer: Self-pay | Admitting: General Practice

## 2017-07-02 ENCOUNTER — Ambulatory Visit: Payer: Medicaid Other | Admitting: Advanced Practice Midwife

## 2017-07-02 ENCOUNTER — Encounter: Payer: Self-pay | Admitting: Advanced Practice Midwife

## 2017-07-02 VITALS — BP 118/72 | HR 102 | Wt 156.3 lb

## 2017-07-02 DIAGNOSIS — Z3480 Encounter for supervision of other normal pregnancy, unspecified trimester: Secondary | ICD-10-CM

## 2017-07-02 DIAGNOSIS — Z3483 Encounter for supervision of other normal pregnancy, third trimester: Secondary | ICD-10-CM

## 2017-07-02 LAB — OB RESULTS CONSOLE GBS: STREP GROUP B AG: NEGATIVE

## 2017-07-02 NOTE — Progress Notes (Signed)
   PRENATAL VISIT NOTE  Subjective:  Kathryn Silva is a 21 y.o. G2P0010 at 3470w6d being seen today for ongoing prenatal care.  She is currently monitored for the following issues for this low-risk pregnancy and has History of first trimester miscarriage, currently pregnant and Supervision of other normal pregnancy, antepartum on their problem list.  Patient reports backache, headache and patient asked for work note to be out for remainder of pregnancy due to stress at work causing backache and headaches .  Contractions: Not present. Vag. Bleeding: None.  Movement: Present. Denies leaking of fluid.   The following portions of the patient's history were reviewed and updated as appropriate: allergies, current medications, past family history, past medical history, past social history, past surgical history and problem list. Problem list updated.  Objective:   Vitals:   07/02/17 1538  BP: 118/72  Pulse: (!) 102  Weight: 156 lb 4.8 oz (70.9 kg)    Fetal Status: Fetal Heart Rate (bpm): 156 Fundal Height: 33 cm Movement: Present  Presentation: Vertex  General:  Alert, oriented and cooperative. Patient is in no acute distress.  Skin: Skin is warm and dry. No rash noted.   Cardiovascular: Normal heart rate noted  Respiratory: Normal respiratory effort, no problems with respiration noted  Abdomen: Soft, gravid, appropriate for gestational age.  Pain/Pressure: Present     Pelvic: Cervical exam performed Dilation: 1 Effacement (%): Thick Station: -3  Extremities: Normal range of motion.  Edema: Trace  Mental Status:  Normal mood and affect. Normal behavior. Normal judgment and thought content.   Assessment and Plan:  Pregnancy: G2P0010 at 1270w6d  1. Supervision of other normal pregnancy, antepartum -Work note given to patient  - Culture, beta strep (group b only)  Term labor symptoms and general obstetric precautions including but not limited to vaginal bleeding, contractions, leaking of fluid  and fetal movement were reviewed in detail with the patient. Please refer to After Visit Summary for other counseling recommendations.  Return in about 1 week (around 07/09/2017) for ROB.  TOC at prenatal visit on 07/09/2017   Kathryn Silva, Ina HomesCNM  07/02/17

## 2017-07-02 NOTE — Patient Instructions (Signed)
Braxton Hicks Contractions Contractions of the uterus can occur throughout pregnancy, but they are not always a sign that you are in labor. You may have practice contractions called Braxton Hicks contractions. These false labor contractions are sometimes confused with true labor. What are Braxton Hicks contractions? Braxton Hicks contractions are tightening movements that occur in the muscles of the uterus before labor. Unlike true labor contractions, these contractions do not result in opening (dilation) and thinning of the cervix. Toward the end of pregnancy (32-34 weeks), Braxton Hicks contractions can happen more often and may become stronger. These contractions are sometimes difficult to tell apart from true labor because they can be very uncomfortable. You should not feel embarrassed if you go to the hospital with false labor. Sometimes, the only way to tell if you are in true labor is for your health care provider to look for changes in the cervix. The health care provider will do a physical exam and may monitor your contractions. If you are not in true labor, the exam should show that your cervix is not dilating and your water has not broken. If there are no prenatal problems or other health problems associated with your pregnancy, it is completely safe for you to be sent home with false labor. You may continue to have Braxton Hicks contractions until you go into true labor. How can I tell the difference between true labor and false labor?  Differences  False labor  Contractions last 30-70 seconds.: Contractions are usually shorter and not as strong as true labor contractions.  Contractions become very regular.: Contractions are usually irregular.  Discomfort is usually felt in the top of the uterus, and it spreads to the lower abdomen and low back.: Contractions are often felt in the front of the lower abdomen and in the groin.  Contractions do not go away with walking.: Contractions may  go away when you walk around or change positions while lying down.  Contractions usually become more intense and increase in frequency.: Contractions get weaker and are shorter-lasting as time goes on.  The cervix dilates and gets thinner.: The cervix usually does not dilate or become thin. Follow these instructions at home:  Take over-the-counter and prescription medicines only as told by your health care provider.  Keep up with your usual exercises and follow other instructions from your health care provider.  Eat and drink lightly if you think you are going into labor.  If Braxton Hicks contractions are making you uncomfortable:  Change your position from lying down or resting to walking, or change from walking to resting.  Sit and rest in a tub of warm water.  Drink enough fluid to keep your urine clear or pale yellow. Dehydration may cause these contractions.  Do slow and deep breathing several times an hour.  Keep all follow-up prenatal visits as told by your health care provider. This is important. Contact a health care provider if:  You have a fever.  You have continuous pain in your abdomen. Get help right away if:  Your contractions become stronger, more regular, and closer together.  You have fluid leaking or gushing from your vagina.  You pass blood-tinged mucus (bloody show).  You have bleeding from your vagina.  You have low back pain that you never had before.  You feel your baby's head pushing down and causing pelvic pressure.  Your baby is not moving inside you as much as it used to. Summary  Contractions that occur before labor are   called Braxton Hicks contractions, false labor, or practice contractions.  Braxton Hicks contractions are usually shorter, weaker, farther apart, and less regular than true labor contractions. True labor contractions usually become progressively stronger and regular and they become more frequent.  Manage discomfort from  Braxton Hicks contractions by changing position, resting in a warm bath, drinking plenty of water, or practicing deep breathing. This information is not intended to replace advice given to you by your health care provider. Make sure you discuss any questions you have with your health care provider. Document Released: 07/17/2005 Document Revised: 06/05/2016 Document Reviewed: 06/05/2016 Elsevier Interactive Patient Education  2017 [employer] Third Trimester of Pregnancy The third trimester is from week 28 through week 40 (months 7 through 9). The third trimester is a time when the unborn baby (fetus) is growing rapidly. At the end of the ninth month, the fetus is about 20 inches in length and weighs 6-10 pounds. Body changes during your third trimester Your body will continue to go through many changes during pregnancy. The changes vary from woman to woman. During the third trimester:  Your weight will continue to increase. You can expect to gain 25-35 pounds (11-16 kg) by the end of the pregnancy.  You may begin to get stretch marks on your hips, abdomen, and breasts.  You may urinate more often because the fetus is moving lower into your pelvis and pressing on your bladder.  You may develop or continue to have heartburn. This is caused by increased hormones that slow down muscles in the digestive tract.  You may develop or continue to have constipation because increased hormones slow digestion and cause the muscles that push waste through your intestines to relax.  You may develop hemorrhoids. These are swollen veins (varicose veins) in the rectum that can itch or be painful.  You may develop swollen, bulging veins (varicose veins) in your legs.  You may have increased body aches in the pelvis, back, or thighs. This is due to weight gain and increased hormones that are relaxing your joints.  You may have changes in your hair. These can include thickening of your hair, rapid growth, and  changes in texture. Some women also have hair loss during or after pregnancy, or hair that feels dry or thin. Your hair will most likely return to normal after your baby is born.  Your breasts will continue to grow and they will continue to become tender. A yellow fluid (colostrum) may leak from your breasts. This is the first milk you are producing for your baby.  Your belly button may stick out.  You may notice more swelling in your hands, face, or ankles.  You may have increased tingling or numbness in your hands, arms, and legs. The skin on your belly may also feel numb.  You may feel short of breath because of your expanding uterus.  You may have more problems sleeping. This can be caused by the size of your belly, increased need to urinate, and an increase in your body's metabolism.  You may notice the fetus "dropping," or moving lower in your abdomen (lightening).  You may have increased vaginal discharge.  You may notice your joints feel loose and you may have pain around your pelvic bone. What to expect at prenatal visits You will have prenatal exams every 2 weeks until week 36. Then you will have weekly prenatal exams. During a routine prenatal visit:  You will be weighed to make sure you and the baby   are growing normally.  Your blood pressure will be taken.  Your abdomen will be measured to track your baby's growth.  The fetal heartbeat will be listened to.  Any test results from the previous visit will be discussed.  You may have a cervical check near your due date to see if your cervix has softened or thinned (effaced).  You will be tested for Group B streptococcus. This happens between 35 and 37 weeks. Your health care provider may ask you:  What your birth plan is.  How you are feeling.  If you are feeling the baby move.  If you have had any abnormal symptoms, such as leaking fluid, bleeding, severe headaches, or abdominal cramping.  If you are using any  tobacco products, including cigarettes, chewing tobacco, and electronic cigarettes.  If you have any questions. Other tests or screenings that may be performed during your third trimester include:  Blood tests that check for low iron levels (anemia).  Fetal testing to check the health, activity level, and growth of the fetus. Testing is done if you have certain medical conditions or if there are problems during the pregnancy.  Nonstress test (NST). This test checks the health of your baby to make sure there are no signs of problems, such as the baby not getting enough oxygen. During this test, a belt is placed around your belly. The baby is made to move, and its heart rate is monitored during movement. What is false labor? False labor is a condition in which you feel small, irregular tightenings of the muscles in the womb (contractions) that usually go away with rest, changing position, or drinking water. These are called Braxton Hicks contractions. Contractions may last for hours, days, or even weeks before true labor sets in. If contractions come at regular intervals, become more frequent, increase in intensity, or become painful, you should see your health care provider. What are the signs of labor?  Abdominal cramps.  Regular contractions that start at 10 minutes apart and become stronger and more frequent with time.  Contractions that start on the top of the uterus and spread down to the lower abdomen and back.  Increased pelvic pressure and dull back pain.  A watery or bloody mucus discharge that comes from the vagina.  Leaking of amniotic fluid. This is also known as your "water breaking." It could be a slow trickle or a gush. Let your health care provider know if it has a color or strange odor. If you have any of these signs, call your health care provider right away, even if it is before your due date. Follow these instructions at home: Medicines   Follow your health care  provider's instructions regarding medicine use. Specific medicines may be either safe or unsafe to take during pregnancy.  Take a prenatal vitamin that contains at least 600 micrograms (mcg) of folic acid.  If you develop constipation, try taking a stool softener if your health care provider approves. Eating and drinking   Eat a balanced diet that includes fresh fruits and vegetables, whole grains, good sources of protein such as meat, eggs, or tofu, and low-fat dairy. Your health care provider will help you determine the amount of weight gain that is right for you.  Avoid raw meat and uncooked cheese. These carry germs that can cause birth defects in the baby.  If you have low calcium intake from food, talk to your health care provider about whether you should take a daily calcium supplement.    Eat four or five small meals rather than three large meals a day.  Limit foods that are high in fat and processed sugars, such as fried and sweet foods.  To prevent constipation:  Drink enough fluid to keep your urine clear or pale yellow.  Eat foods that are high in fiber, such as fresh fruits and vegetables, whole grains, and beans. Activity   Exercise only as directed by your health care provider. Most women can continue their usual exercise routine during pregnancy. Try to exercise for 30 minutes at least 5 days a week. Stop exercising if you experience uterine contractions.  Avoid heavy lifting.  Do not exercise in extreme heat or humidity, or at high altitudes.  Wear low-heel, comfortable shoes.  Practice good posture.  You may continue to have sex unless your health care provider tells you otherwise. Relieving pain and discomfort   Take frequent breaks and rest with your legs elevated if you have leg cramps or low back pain.  Take warm sitz baths to soothe any pain or discomfort caused by hemorrhoids. Use hemorrhoid cream if your health care provider approves.  Wear a good  support bra to prevent discomfort from breast tenderness.  If you develop varicose veins:  Wear support pantyhose or compression stockings as told by your healthcare provider.  Elevate your feet for 15 minutes, 3-4 times a day. Prenatal care   Write down your questions. Take them to your prenatal visits.  Keep all your prenatal visits as told by your health care provider. This is important. Safety   Wear your seat belt at all times when driving.  Make a list of emergency phone numbers, including numbers for family, friends, the hospital, and police and fire departments. General instructions   Avoid cat litter boxes and soil used by cats. These carry germs that can cause birth defects in the baby. If you have a cat, ask someone to clean the litter box for you.  Do not travel far distances unless it is absolutely necessary and only with the approval of your health care provider.  Do not use hot tubs, steam rooms, or saunas.  Do not drink alcohol.  Do not use any products that contain nicotine or tobacco, such as cigarettes and e-cigarettes. If you need help quitting, ask your health care provider.  Do not use any medicinal herbs or unprescribed drugs. These chemicals affect the formation and growth of the baby.  Do not douche or use tampons or scented sanitary pads.  Do not cross your legs for long periods of time.  To prepare for the arrival of your baby:  Take prenatal classes to understand, practice, and ask questions about labor and delivery.  Make a trial run to the hospital.  Visit the hospital and tour the maternity area.  Arrange for maternity or paternity leave through employers.  Arrange for family and friends to take care of pets while you are in the hospital.  Purchase a rear-facing car seat and make sure you know how to install it in your car.  Pack your hospital bag.  Prepare the baby's nursery. Make sure to remove all pillows and stuffed animals from  the baby's crib to prevent suffocation.  Visit your dentist if you have not gone during your pregnancy. Use a soft toothbrush to brush your teeth and be gentle when you floss. Contact a health care provider if:  You are unsure if you are in labor or if your water has broken.  You   become dizzy.  You have mild pelvic cramps, pelvic pressure, or nagging pain in your abdominal area.  You have lower back pain.  You have persistent nausea, vomiting, or diarrhea.  You have an unusual or bad smelling vaginal discharge.  You have pain when you urinate. Get help right away if:  Your water breaks before 37 weeks.  You have regular contractions less than 5 minutes apart before 37 weeks.  You have a fever.  You are leaking fluid from your vagina.  You have spotting or bleeding from your vagina.  You have severe abdominal pain or cramping.  You have rapid weight loss or weight gain.  You have shortness of breath with chest pain.  You notice sudden or extreme swelling of your face, hands, ankles, feet, or legs.  Your baby makes fewer than 10 movements in 2 hours.  You have severe headaches that do not go away when you take medicine.  You have vision changes. Summary  The third trimester is from week 28 through week 40, months 7 through 9. The third trimester is a time when the unborn baby (fetus) is growing rapidly.  During the third trimester, your discomfort may increase as you and your baby continue to gain weight. You may have abdominal, leg, and back pain, sleeping problems, and an increased need to urinate.  During the third trimester your breasts will keep growing and they will continue to become tender. A yellow fluid (colostrum) may leak from your breasts. This is the first milk you are producing for your baby.  False labor is a condition in which you feel small, irregular tightenings of the muscles in the womb (contractions) that eventually go away. These are called  Braxton Hicks contractions. Contractions may last for hours, days, or even weeks before true labor sets in.  Signs of labor can include: abdominal cramps; regular contractions that start at 10 minutes apart and become stronger and more frequent with time; watery or bloody mucus discharge that comes from the vagina; increased pelvic pressure and dull back pain; and leaking of amniotic fluid. This information is not intended to replace advice given to you by your health care provider. Make sure you discuss any questions you have with your health care provider. Document Released: 07/11/2001 Document Revised: 12/23/2015 Document Reviewed: 09/17/2012 Elsevier Interactive Patient Education  2017 [employer]  

## 2017-07-06 LAB — CULTURE, BETA STREP (GROUP B ONLY): STREP GP B CULTURE: NEGATIVE

## 2017-07-09 ENCOUNTER — Encounter: Payer: Medicaid Other | Admitting: Advanced Practice Midwife

## 2017-07-16 ENCOUNTER — Encounter: Payer: Self-pay | Admitting: Medical

## 2017-07-16 ENCOUNTER — Other Ambulatory Visit (HOSPITAL_COMMUNITY)
Admission: RE | Admit: 2017-07-16 | Discharge: 2017-07-16 | Disposition: A | Discharge status: Home / Self Care | Payer: Medicaid Other | Source: Ambulatory Visit | Attending: Medical | Admitting: Medical

## 2017-07-16 ENCOUNTER — Ambulatory Visit (INDEPENDENT_AMBULATORY_CARE_PROVIDER_SITE_OTHER): Payer: Medicaid Other | Admitting: Medical

## 2017-07-16 VITALS — BP 124/61 | HR 90 | Wt 158.0 lb

## 2017-07-16 DIAGNOSIS — Z202 Contact with and (suspected) exposure to infections with a predominantly sexual mode of transmission: Secondary | ICD-10-CM

## 2017-07-16 DIAGNOSIS — Z3480 Encounter for supervision of other normal pregnancy, unspecified trimester: Secondary | ICD-10-CM

## 2017-07-16 DIAGNOSIS — Z3483 Encounter for supervision of other normal pregnancy, third trimester: Secondary | ICD-10-CM

## 2017-07-16 NOTE — Progress Notes (Signed)
   PRENATAL VISIT NOTE  Subjective:  Kathryn Silva is a 21 y.o. G2P0010 at 5944w6d being seen today for ongoing prenatal care.  She is currently monitored for the following issues for this low-risk pregnancy and has History of first trimester miscarriage, currently pregnant and Supervision of other normal pregnancy, antepartum on their problem list.  Patient reports no complaints.  Contractions: Irregular. Vag. Bleeding: None.  Movement: Present. Denies leaking of fluid.   The following portions of the patient's history were reviewed and updated as appropriate: allergies, current medications, past family history, past medical history, past social history, past surgical history and problem list. Problem list updated.  Objective:   Vitals:   07/16/17 1018  BP: 124/61  Pulse: 90  Weight: 158 lb (71.7 kg)    Fetal Status: Fetal Heart Rate (bpm): 156 Fundal Height: 39 cm Movement: Present  Presentation: Vertex  General:  Alert, oriented and cooperative. Patient is in no acute distress.  Skin: Skin is warm and dry. No rash noted.   Cardiovascular: Normal heart rate noted  Respiratory: Normal respiratory effort, no problems with respiration noted  Abdomen: Soft, gravid, appropriate for gestational age.  Pain/Pressure: Absent     Pelvic: Cervical exam deferred Dilation: 1.5 Effacement (%): Thick Station: -3  Extremities: Normal range of motion.  Edema: Trace  Mental Status:  Normal mood and affect. Normal behavior. Normal judgment and thought content.   Assessment and Plan:  Pregnancy: G2P0010 at 7544w6d  1. Supervision of other normal pregnancy, antepartum - Doing well, only infrequent contractions - Mild fatigue  2. Chlamydia contact, treated - Cervicovaginal ancillary only - TOC, collected today  - Patient will be notified if results are positive, otherwise to be discussed at next visit  Term labor symptoms and general obstetric precautions including but not limited to vaginal  bleeding, contractions, leaking of fluid and fetal movement were reviewed in detail with the patient. Please refer to After Visit Summary for other counseling recommendations.  Return in about 1 week (around 07/23/2017) for LOB.   Vonzella NippleJulie Wenzel, PA-C

## 2017-07-16 NOTE — Patient Instructions (Signed)
Fetal Movement Counts °Patient Name: ________________________________________________ Patient Due Date: ____________________ °What is a fetal movement count? °A fetal movement count is the number of times that you feel your baby move during a certain amount of time. This may also be called a fetal kick count. A fetal movement count is recommended for every pregnant woman. You may be asked to start counting fetal movements as early as week 28 of your pregnancy. °Pay attention to when your baby is most active. You may notice your baby's sleep and wake cycles. You may also notice things that make your baby move more. You should do a fetal movement count: °· When your baby is normally most active. °· At the same time each day. ° °A good time to count movements is while you are resting, after having something to eat and drink. °How do I count fetal movements? °1. Find a quiet, comfortable area. Sit, or lie down on your side. °2. Write down the date, the start time and stop time, and the number of movements that you felt between those two times. Take this information with you to your health care visits. °3. For 2 hours, count kicks, flutters, swishes, rolls, and jabs. You should feel at least 10 movements during 2 hours. °4. You may stop counting after you have felt 10 movements. °5. If you do not feel 10 movements in 2 hours, have something to eat and drink. Then, keep resting and counting for 1 hour. If you feel at least 4 movements during that hour, you may stop counting. °Contact a health care provider if: °· You feel fewer than 4 movements in 2 hours. °· Your baby is not moving like he or she usually does. °Date: ____________ Start time: ____________ Stop time: ____________ Movements: ____________ °Date: ____________ Start time: ____________ Stop time: ____________ Movements: ____________ °Date: ____________ Start time: ____________ Stop time: ____________ Movements: ____________ °Date: ____________ Start time:  ____________ Stop time: ____________ Movements: ____________ °Date: ____________ Start time: ____________ Stop time: ____________ Movements: ____________ °Date: ____________ Start time: ____________ Stop time: ____________ Movements: ____________ °Date: ____________ Start time: ____________ Stop time: ____________ Movements: ____________ °Date: ____________ Start time: ____________ Stop time: ____________ Movements: ____________ °Date: ____________ Start time: ____________ Stop time: ____________ Movements: ____________ °This information is not intended to replace advice given to you by your health care provider. Make sure you discuss any questions you have with your health care provider. °Document Released: 08/16/2006 Document Revised: 03/15/2016 Document Reviewed: 08/26/2015 °Elsevier Interactive Patient Education © 2018 Elsevier [employer] °Braxton Hicks Contractions °Contractions of the uterus can occur throughout pregnancy, but they are not always a sign that you are in labor. You may have practice contractions called Braxton Hicks contractions. These false labor contractions are sometimes confused with true labor. °What are Braxton Hicks contractions? °Braxton Hicks contractions are tightening movements that occur in the muscles of the uterus before labor. Unlike true labor contractions, these contractions do not result in opening (dilation) and thinning of the cervix. Toward the end of pregnancy (32-34 weeks), Braxton Hicks contractions can happen more often and may become stronger. These contractions are sometimes difficult to tell apart from true labor because they can be very uncomfortable. You should not feel embarrassed if you go to the hospital with false labor. °Sometimes, the only way to tell if you are in true labor is for your health care provider to look for changes in the cervix. The health care provider will do a physical exam and may monitor your contractions. If   you are not in true labor, the exam  should show that your cervix is not dilating and your water has not broken. °If there are no prenatal problems or other health problems associated with your pregnancy, it is completely safe for you to be sent home with false labor. You may continue to have Braxton Hicks contractions until you go into true labor. °How can I tell the difference between true labor and false labor? °· Differences °? False labor °? Contractions last 30-70 seconds.: Contractions are usually shorter and not as strong as true labor contractions. °? Contractions become very regular.: Contractions are usually irregular. °? Discomfort is usually felt in the top of the uterus, and it spreads to the lower abdomen and low back.: Contractions are often felt in the front of the lower abdomen and in the groin. °? Contractions do not go away with walking.: Contractions may go away when you walk around or change positions while lying down. °? Contractions usually become more intense and increase in frequency.: Contractions get weaker and are shorter-lasting as time goes on. °? The cervix dilates and gets thinner.: The cervix usually does not dilate or become thin. °Follow these instructions at home: °· Take over-the-counter and prescription medicines only as told by your health care provider. °· Keep up with your usual exercises and follow other instructions from your health care provider. °· Eat and drink lightly if you think you are going into labor. °· If Braxton Hicks contractions are making you uncomfortable: °? Change your position from lying down or resting to walking, or change from walking to resting. °? Sit and rest in a tub of warm water. °? Drink enough fluid to keep your urine clear or pale yellow. Dehydration may cause these contractions. °? Do slow and deep breathing several times an hour. °· Keep all follow-up prenatal visits as told by your health care provider. This is important. °Contact a health care provider if: °· You have a  fever. °· You have continuous pain in your abdomen. °Get help right away if: °· Your contractions become stronger, more regular, and closer together. °· You have fluid leaking or gushing from your vagina. °· You pass blood-tinged mucus (bloody show). °· You have bleeding from your vagina. °· You have low back pain that you never had before. °· You feel your baby’s head pushing down and causing pelvic pressure. °· Your baby is not moving inside you as much as it used to. °Summary °· Contractions that occur before labor are called Braxton Hicks contractions, false labor, or practice contractions. °· Braxton Hicks contractions are usually shorter, weaker, farther apart, and less regular than true labor contractions. True labor contractions usually become progressively stronger and regular and they become more frequent. °· Manage discomfort from Braxton Hicks contractions by changing position, resting in a warm bath, drinking plenty of water, or practicing deep breathing. °This information is not intended to replace advice given to you by your health care provider. Make sure you discuss any questions you have with your health care provider. °Document Released: 07/17/2005 Document Revised: 06/05/2016 Document Reviewed: 06/05/2016 °Elsevier Interactive Patient Education © 2017 Elsevier Inc. ° °

## 2017-07-17 LAB — CERVICOVAGINAL ANCILLARY ONLY
Chlamydia: POSITIVE — AB
Neisseria Gonorrhea: NEGATIVE

## 2017-07-18 ENCOUNTER — Other Ambulatory Visit: Payer: Self-pay | Admitting: Medical

## 2017-07-18 ENCOUNTER — Telehealth: Payer: Self-pay | Admitting: *Deleted

## 2017-07-18 DIAGNOSIS — O98813 Other maternal infectious and parasitic diseases complicating pregnancy, third trimester: Principal | ICD-10-CM

## 2017-07-18 DIAGNOSIS — A749 Chlamydial infection, unspecified: Secondary | ICD-10-CM

## 2017-07-18 MED ORDER — AZITHROMYCIN 250 MG PO TABS: 1000.0000 mg | ORAL_TABLET | Freq: Once | ORAL | 0 refills | Status: AC

## 2017-07-18 NOTE — Telephone Encounter (Signed)
Per Raynelle FanningJulie, called patient and informed her of results. Rx to pharmacy. Advised her to go get it today and take it. Patient voiced understanding. Stated her partner has been treated.

## 2017-07-18 NOTE — Telephone Encounter (Signed)
-----   Message from Marny LowensteinJulie N Wenzel, PA-C sent at 07/18/2017 11:19 AM EST ----- TOC for Chlamydia from 12/17 still shows +Chlamydia. Needs treatment ASAP. Rx sent to pharmacy. MyChart not active, please call patient.   Marny LowensteinWenzel, Julie N, PA-C 07/18/2017 11:18 AM

## 2017-07-21 ENCOUNTER — Inpatient Hospital Stay (HOSPITAL_COMMUNITY)
Admission: AD | Admit: 2017-07-21 | Discharge: 2017-07-23 | DRG: 807 | Disposition: A | Discharge status: Home / Self Care | Payer: Medicaid Other | Source: Ambulatory Visit | Attending: Obstetrics & Gynecology | Admitting: Obstetrics & Gynecology

## 2017-07-21 ENCOUNTER — Encounter (HOSPITAL_COMMUNITY): Payer: Self-pay

## 2017-07-21 DIAGNOSIS — O4292 Full-term premature rupture of membranes, unspecified as to length of time between rupture and onset of labor: Principal | ICD-10-CM | POA: Diagnosis present

## 2017-07-21 DIAGNOSIS — Z3A39 39 weeks gestation of pregnancy: Secondary | ICD-10-CM

## 2017-07-21 LAB — CBC
HCT: 36.3 % (ref 36.0–46.0)
HEMOGLOBIN: 12.6 g/dL (ref 12.0–15.0)
MCH: 30.2 pg (ref 26.0–34.0)
MCHC: 34.7 g/dL (ref 30.0–36.0)
MCV: 87.1 fL (ref 78.0–100.0)
PLATELETS: 216 10*3/uL (ref 150–400)
RBC: 4.17 MIL/uL (ref 3.87–5.11)
RDW: 12.2 % (ref 11.5–15.5)
WBC: 8.2 10*3/uL (ref 4.0–10.5)

## 2017-07-21 LAB — TYPE AND SCREEN
ABO/RH(D): O POS
Antibody Screen: NEGATIVE

## 2017-07-21 MED ORDER — OXYTOCIN 40 UNITS IN LACTATED RINGERS INFUSION - SIMPLE MED: 2.5000 [IU]/h | INTRAVENOUS | Status: DC

## 2017-07-21 MED ORDER — PHENYLEPHRINE 40 MCG/ML (10ML) SYRINGE FOR IV PUSH (FOR BLOOD PRESSURE SUPPORT)
80.0000 ug | PREFILLED_SYRINGE | INTRAVENOUS | Status: DC | PRN
  Filled 2017-07-21: qty 5

## 2017-07-21 MED ORDER — OXYCODONE-ACETAMINOPHEN 5-325 MG PO TABS
1.0000 | ORAL_TABLET | ORAL | Status: DC | PRN
Start: 2017-07-21 — End: 2017-07-22

## 2017-07-21 MED ORDER — OXYTOCIN BOLUS FROM INFUSION
500.0000 mL | Freq: Once | INTRAVENOUS | Status: AC
  Administered 2017-07-22: 500 mL via INTRAVENOUS

## 2017-07-21 MED ORDER — FENTANYL 2.5 MCG/ML BUPIVACAINE 1/10 % EPIDURAL INFUSION (WH - ANES): 14.0000 mL/h | INTRAMUSCULAR | Status: DC | PRN

## 2017-07-21 MED ORDER — EPHEDRINE 5 MG/ML INJ
10.0000 mg | INTRAVENOUS | Status: DC | PRN
  Filled 2017-07-21: qty 2

## 2017-07-21 MED ORDER — DIPHENHYDRAMINE HCL 50 MG/ML IJ SOLN: 12.5000 mg | INTRAMUSCULAR | Status: DC | PRN

## 2017-07-21 MED ORDER — ACETAMINOPHEN 325 MG PO TABS: 650.0000 mg | ORAL_TABLET | ORAL | Status: DC | PRN

## 2017-07-21 MED ORDER — OXYCODONE-ACETAMINOPHEN 5-325 MG PO TABS: 2.0000 | ORAL_TABLET | ORAL | Status: DC | PRN

## 2017-07-21 MED ORDER — OXYCODONE-ACETAMINOPHEN 5-325 MG PO TABS
2.0000 | ORAL_TABLET | Freq: Once | ORAL | Status: AC
  Administered 2017-07-21: 2 via ORAL
  Filled 2017-07-21: qty 2

## 2017-07-21 MED ORDER — LACTATED RINGERS IV SOLN: 500.0000 mL | Freq: Once | INTRAVENOUS | Status: DC

## 2017-07-21 MED ORDER — LACTATED RINGERS IV SOLN
INTRAVENOUS | Status: DC
  Administered 2017-07-21 – 2017-07-22 (×3): via INTRAVENOUS

## 2017-07-21 MED ORDER — SOD CITRATE-CITRIC ACID 500-334 MG/5ML PO SOLN: 30.0000 mL | ORAL | Status: DC | PRN

## 2017-07-21 MED ORDER — LIDOCAINE HCL (PF) 1 % IJ SOLN
30.0000 mL | INTRAMUSCULAR | Status: DC | PRN
  Filled 2017-07-21: qty 30

## 2017-07-21 MED ORDER — ONDANSETRON HCL 4 MG/2ML IJ SOLN: 4.0000 mg | Freq: Four times a day (QID) | INTRAMUSCULAR | Status: DC | PRN

## 2017-07-21 MED ORDER — TERBUTALINE SULFATE 1 MG/ML IJ SOLN
0.2500 mg | Freq: Once | INTRAMUSCULAR | Status: DC | PRN
  Filled 2017-07-21: qty 1

## 2017-07-21 MED ORDER — OXYTOCIN 40 UNITS IN LACTATED RINGERS INFUSION - SIMPLE MED
1.0000 m[IU]/min | INTRAVENOUS | Status: DC
  Administered 2017-07-21: 2 m[IU]/min via INTRAVENOUS
  Filled 2017-07-21: qty 1000

## 2017-07-21 MED ORDER — LACTATED RINGERS IV SOLN
500.0000 mL | INTRAVENOUS | Status: DC | PRN
Start: 2017-07-21 — End: 2017-07-22

## 2017-07-21 NOTE — [facility] Provider Note (Signed)
S: Ms. Kathryn Silva is a 21 y.o. G2P0010 at 4134w4d  who presents to [facility]U today complaining of leaking of fluid since this afternoon. She denies vaginal bleeding. She endorses contractions. She reports normal fetal movement.    O: BP 127/73 (BP Location: Left Arm)   Pulse (!) 104   Temp 98.2 F (36.8 C) (Oral)   Resp 18   LMP 09/17/2016 (LMP Unknown)  GENERAL: Well-developed, well-nourished female in no acute distress.  HEAD: Normocephalic, atraumatic.  CHEST: Normal effort of breathing, regular heart rate ABDOMEN: Soft, nontender, gravid PELVIC: Normal external female genitalia. Vagina is pink and rugated. Cervix with normal contour, no lesions. Normal discharge.  positive pooling.   Cervical exam:  Dilation: 4 Effacement (%): 80 Station: -2 Presentation: Vertex Exam by:: l leftwich-kirby cnm   Fetal Monitoring: Baseline: 145 Variability: moderate Accelerations: present Decelerations: not present Contractions: every 2-3 minutes  No results found for this or any previous visit (from the past 24 hour(s)).   A: [facility] at 6434w4d  [facility]  Active labor at term Recent chlamydia positive on 12/17, treated and partner treated per pt  P: [facility]   Hurshel PartyLeftwich-Kirby, Tkai Serfass A, CNM 07/21/2017 1:50 PM

## 2017-07-21 NOTE — Progress Notes (Signed)
Discussing Pitocin

## 2017-07-21 NOTE — Anesthesia Pain Management Evaluation Note (Signed)
  [facility] Pain Management Visit Note  Patient: Kathryn PardoneMia P Silva, 21 y.o., female  "Hello I am a member of the anesthesia team at [facility]. We have an anesthesia team available at all times to provide care throughout the hospital, including epidural management and anesthesia for C-section. I don't know your plan for the delivery whether it a natural birth, water birth, IV sedation, nitrous supplementation, doula or epidural, but we want to meet your pain goals."   1.Was your pain managed to your expectations on prior hospitalizations?   No prior hospitalizations  2.What is your expectation for pain management during this hospitalization?     Labor support without medications  3.How can we help you reach that goal? Wanting natural delivery  Record the patient's initial score and the patient's pain goal.   Pain: 8  Pain Goal: 8 [facility] wants you to be able to say your pain was always managed very well.  [facility] IncMARSHALL,Kathryn Silva 07/21/2017

## 2017-07-21 NOTE — MAU Note (Signed)
Felt some leaking around 0200 and felt some more when she got up today. Not sure if water or discharge. No vaginal bleeding, feeling irregular contractions, +FM

## 2017-07-21 NOTE — MAU Note (Signed)
Urine in lab 

## 2017-07-21 NOTE — H&P (Signed)
LABOR AND DELIVERY ADMISSION HISTORY AND PHYSICAL NOTE  Kathryn Silva is a 21 y.o. female G2P0010 with IUP at 7360w4d by 2ndtri US presenting for IOL for SROM.  She reports positive fetal movement. She denies leakage of fluid or vaginal bleeding.  Pren[facility]/Complications[facility] P[facility] complications:  - chlamidia during pregnancy  Clinic [facility] Prenatal Labs  Dating  2nd trimester US Blood type: O/Positive/-- (08/20 1354)   Genetic Screen 1 Screen: too late      Quad: declines     Antibody:Negative (08/20 1354) negative  Anatomic US  Normal - female Rubella: 6.07 (08/20 1354)immune  GTT Early:  N/A             Third trimester:  RPR: Non Reactive (08/20 1354)   Flu vaccine  Declined HBsAg: Negative (08/20 1354) negative  TDaP vaccine Declined                                     HIV:   negative  Baby Food   breast                                            GBS:   Contraception  IUD Pap: < 21  Circumcision  Yes (outpatient)    Pediatrician  list given CF:  Support Person  Mother & fob-(Keith Leonette MonarchGaston) SMA  Prenatal Classes  no Hgb electrophoresis: normal    Past Medical History: Past Medical History:  Diagnosis Date  . Asthma   . History of one miscarriage 09/26/2016   08/2016: 11-12wks. No PNC. Came into OB triage VB, pain and BBOW. See surg path for fetus (+abnormalities). Needed D&C for rPOCs. Negative KB, TSH, rpr, hiv, cbc, GC/CT, wet prep, U/a, UDS.     Past Surgical History: Past Surgical History:  Procedure Laterality Date  . DILATION AND EVACUATION N/A 09/22/2016   Procedure: SUCTION DILATATION AND EVACUATION;  Surgeon: Spencer Bingharlie Pickens, MD;  Location: [facility];  Service: Gynecology;  Laterality: N/A;    Obstetrical History: OB History    Gravida Para Term Preterm AB Living   2       1 0   SAB TAB Ectopic Multiple Live Births   1              Social History: Social History   Socioeconomic History  . Marital status: Single    Spouse name: None  . Number  of children: None  . Years of education: None  . Highest education level: None  Social Needs  . Financial resource strain: None  . Food insecurity - worry: None  . Food insecurity - inability: None  . Transportation needs - medical: None  . Transportation needs - non-medical: None  Occupational History  . None  Tobacco Use  . Smoking status: Never Smoker  . Smokeless tobacco: Never Used  Substance and Sexual Activity  . Alcohol use: No  . Drug use: No  . Sexual activity: Yes  Other Topics Concern  . None  Social History Narrative  . None    Family History: Family History  Problem Relation Age of Onset  . Hypertension Mother     Allergies: No Known Allergies  Medications Prior to Admission  Medication Sig Dispense Refill Last Dose  . Prenatal Vit-Fe Fumarate-FA (PRENATAL  MULTIVITAMIN) TABS tablet Take 1 tablet by mouth daily at 12 noon.   07/20/2017 at Unknown time  . ondansetron (ZOFRAN) 4 MG tablet Take 1 tablet (4 mg total) by mouth every 8 (eight) hours as needed for nausea or vomiting. (Patient not taking: Reported on 07/02/2017) 20 tablet 0 Not Taking at Unknown time     Review of Systems  All systems reviewed and negative except as stated in HPI  Physical Exam Blood pressure 127/73, pulse (!) 104, temperature 98.2 F (36.8 C), temperature source Oral, resp. rate 18, last menstrual period 09/17/2016, unknown if currently breastfeeding. General appearance: alert, cooperative, appears stated age and no distress Lungs: no IWB Heart: regular rate  Abdomen: soft, non-tender; bowel sounds normal Extremities: No calf swelling or tenderness Presentation: cephalic Fetal monitoring: cat 1  Uterine activity: irregular contraction Dilation: 4 Effacement (%): 80 Station: -2 Exam by:: l leftwich-kirby cnm  Prenatal labs: ABO, Rh: O/Positive/-- (08/20 1354) Antibody: Negative (08/20 1354) Rubella: 6.07 (08/20 1354) RPR: Non Reactive (10/08 0934)  HBsAg: Negative  (08/20 1354)  HIV:   NR GC/Chlamydia: chlamidia pos GBS:   neg 1 hr Glucola: 82/147/114 Genetic screening:  declines Anatomy US: normal  Prenatal Transfer Tool  Maternal Diabetes: No Genetic Screening: Declined Maternal Ultrasounds/Referrals: Normal Fetal Ultrasounds or other Referrals:  None Maternal Substance Abuse:  No Significant Maternal Medications:  None Significant Maternal Lab Results: Lab values include: Other:  chlamidia pos  No results found for this or any previous visit (from the past 24 hour(s)).  Patient Active Problem List   Diagnosis Date Noted  . Supervision of other normal pregnancy, antepartum 03/19/2017  . History of first trimester miscarriage, currently pregnant 09/26/2016    Assessment: Kathryn Silva is a [age] y.o. G2P0010 at 7062w4d here for IOL for SROM  #Labor: IOL, likely pit #Pain: Per maternal request #FWB: Cat 1 #ID:  chlamidia pos, GBS neg #MOF: breast #MOC:IUD #Circ:  Yes, outpatient  Kathryn Silva 07/21/2017, 1:33 PM   I confirm that I have verified the information documented in the resident's note and that I have also personally reperformed the physical exam and all medical decision making activities. The patient was seen and examined by me also Agree with note NST reactive and reassuring UCs as listed Cervical exams as listed in note  Aviva SignsWilliams, Lynee Rosenbach L, CNM

## 2017-07-22 ENCOUNTER — Encounter (HOSPITAL_COMMUNITY): Payer: Self-pay | Admitting: Family Medicine

## 2017-07-22 DIAGNOSIS — Z3A39 39 weeks gestation of pregnancy: Secondary | ICD-10-CM

## 2017-07-22 LAB — RPR: RPR Ser Ql: NONREACTIVE

## 2017-07-22 MED ORDER — ZOLPIDEM TARTRATE 5 MG PO TABS: 5.0000 mg | ORAL_TABLET | Freq: Every evening | ORAL | Status: DC | PRN

## 2017-07-22 MED ORDER — SIMETHICONE 80 MG PO CHEW: 80.0000 mg | CHEWABLE_TABLET | ORAL | Status: DC | PRN

## 2017-07-22 MED ORDER — ONDANSETRON HCL 4 MG/2ML IJ SOLN: 4.0000 mg | INTRAMUSCULAR | Status: DC | PRN

## 2017-07-22 MED ORDER — BENZOCAINE-MENTHOL 20-0.5 % EX AERO: 1.0000 "application " | INHALATION_SPRAY | CUTANEOUS | Status: DC | PRN

## 2017-07-22 MED ORDER — DIPHENHYDRAMINE HCL 25 MG PO CAPS: 25.0000 mg | ORAL_CAPSULE | Freq: Four times a day (QID) | ORAL | Status: DC | PRN

## 2017-07-22 MED ORDER — TETANUS-DIPHTH-ACELL PERTUSSIS 5-2.5-18.5 LF-MCG/0.5 IM SUSP: 0.5000 mL | Freq: Once | INTRAMUSCULAR | Status: DC

## 2017-07-22 MED ORDER — SENNOSIDES-DOCUSATE SODIUM 8.6-50 MG PO TABS
2.0000 | ORAL_TABLET | ORAL | Status: DC
  Administered 2017-07-23: 2 via ORAL
  Filled 2017-07-22: qty 2

## 2017-07-22 MED ORDER — PRENATAL MULTIVITAMIN CH
1.0000 | ORAL_TABLET | Freq: Every day | ORAL | Status: DC
  Administered 2017-07-23: 1 via ORAL
  Filled 2017-07-22: qty 1

## 2017-07-22 MED ORDER — DIBUCAINE 1 % RE OINT: 1.0000 "application " | TOPICAL_OINTMENT | RECTAL | Status: DC | PRN

## 2017-07-22 MED ORDER — COCONUT OIL OIL: 1.0000 "application " | TOPICAL_OIL | Status: DC | PRN

## 2017-07-22 MED ORDER — ACETAMINOPHEN 325 MG PO TABS
650.0000 mg | ORAL_TABLET | ORAL | Status: DC | PRN
  Administered 2017-07-22: 650 mg via ORAL
  Filled 2017-07-22: qty 2

## 2017-07-22 MED ORDER — IBUPROFEN 600 MG PO TABS
600.0000 mg | ORAL_TABLET | Freq: Four times a day (QID) | ORAL | Status: DC
  Administered 2017-07-22 – 2017-07-23 (×4): 600 mg via ORAL
  Filled 2017-07-22 (×4): qty 1

## 2017-07-22 MED ORDER — ONDANSETRON HCL 4 MG PO TABS: 4.0000 mg | ORAL_TABLET | ORAL | Status: DC | PRN

## 2017-07-22 MED ORDER — WITCH HAZEL-GLYCERIN EX PADS: 1.0000 "application " | MEDICATED_PAD | CUTANEOUS | Status: DC | PRN

## 2017-07-22 NOTE — Progress Notes (Signed)
Called MD regarding patient's temperature being 99.7 orally.  RN encouraged patient to drink lots of fluids; she is complaining of chills and feeling cold.  Heart rate is also running slightly tachy at times; MD aware.  Will update oncoming RN.

## 2017-07-22 NOTE — Progress Notes (Signed)
Patient ID: Kathryn Silva, female   DOB: 07/02/1996, 21 y.o.   MRN: [id number] S: Doing well, pain well tolerated with no epidural.    O: Vitals:   07/22/17 0431 07/22/17 0501 07/22/17 0531 07/22/17 0601  BP: 124/85 123/75 130/83 119/69  Pulse: 90 84 85 81  Resp:      Temp:      TempSrc:      Weight:      Height:         FHT:  FHR: 135 bpm, variability: moderate,  accelerations:  Present,  decelerations:  Present variable, early UC:   regular, every 2-4 minutes SVE:   Dilation: 8.5 Effacement (%): 100 Station: -1 Exam by:: R Mourad Cwikla,CNM   A / P: Augmentation of labor, progressing well  Fetal Wellbeing:  Category I Pain Control:  Labor support without medications  Anticipated MOD:  NSVD  Raelyn Moraolitta Demian Maisel, MSN, CNM 07/22/2017, 6:57 AM

## 2017-07-23 MED ORDER — IBUPROFEN 600 MG PO TABS: 600.0000 mg | ORAL_TABLET | Freq: Four times a day (QID) | ORAL | 0 refills | Status: DC

## 2017-07-23 NOTE — Discharge Instructions (Signed)
Postpartum Care After Vaginal Delivery °The period of time right after you deliver your newborn is called the postpartum period. °What kind of medical care will I receive? °· You may continue to receive fluids and medicines through an IV tube inserted into one of your veins. °· If an incision was made near your vagina (episiotomy) or if you had some vaginal tearing during delivery, cold compresses may be placed on your episiotomy or your tear. This helps to reduce pain and swelling. °· You may be given a squirt bottle to use when you go to the bathroom. You may use this until you are comfortable wiping as usual. To use the squirt bottle, follow these steps: °? Before you urinate, fill the squirt bottle with warm water. Do not use hot water. °? After you urinate, while you are sitting on the toilet, use the squirt bottle to rinse the area around your urethra and vaginal opening. This rinses away any urine and blood. °? You may do this instead of wiping. As you start healing, you may use the squirt bottle before wiping yourself. Make sure to wipe gently. °? Fill the squirt bottle with clean water every time you use the bathroom. °· You will be given sanitary pads to wear. °How can I expect to feel? °· You may not feel the need to urinate for several hours after delivery. °· You will have some soreness and pain in your abdomen and vagina. °· If you are breastfeeding, you may have uterine contractions every time you breastfeed for up to several weeks postpartum. Uterine contractions help your uterus return to its normal size. °· It is normal to have vaginal bleeding (lochia) after delivery. The amount and appearance of lochia is often similar to a menstrual period in the first week after delivery. It will gradually decrease over the next few weeks to a dry, yellow-brown discharge. For most women, lochia stops completely by 6-8 weeks after delivery. Vaginal bleeding can vary from woman to woman. °· Within the first few  days after delivery, you may have breast engorgement. This is when your breasts feel heavy, full, and uncomfortable. Your breasts may also throb and feel hard, tightly stretched, warm, and tender. After this occurs, you may have milk leaking from your breasts. Your health care provider can help you relieve discomfort due to breast engorgement. Breast engorgement should go away within a few days. °· You may feel more sad or worried than normal due to hormonal changes after delivery. These feelings should not last more than a few days. If these feelings do not go away after several days, speak with your health care provider. °How should I care for myself? °· Tell your health care provider if you have pain or discomfort. °· Drink enough water to keep your urine clear or pale yellow. °· Wash your hands thoroughly with soap and water for at least 20 seconds after changing your sanitary pads, after using the toilet, and before holding or feeding your baby. °· If you are not breastfeeding, avoid touching your breasts a lot. Doing this can make your breasts produce more milk. °· If you become weak or lightheaded, or you feel like you might faint, ask for help before: °? Getting out of bed. °? Showering. °· Change your sanitary pads frequently. Watch for any changes in your flow, such as a sudden increase in volume, a change in color, the passing of large blood clots. If you pass a blood clot from your vagina, save it   to show to your health care provider. Do not flush blood clots down the toilet without having your health care provider look at them. °· Make sure that all your vaccinations are up to date. This can help protect you and your baby from getting certain diseases. You may need to have immunizations done before you leave the hospital. °· If desired, talk with your health care provider about methods of family planning or birth control (contraception). °How can I start bonding with my baby? °Spending as much time as  possible with your baby is very important. During this time, you and your baby can get to know each other and develop a bond. Having your baby stay with you in your room (rooming in) can give you time to get to know your baby. Rooming in can also help you become comfortable caring for your baby. Breastfeeding can also help you bond with your baby. °How can I plan for returning home with my baby? °· Make sure that you have a car seat installed in your vehicle. °? Your car seat should be checked by a certified car seat installer to make sure that it is installed safely. °? Make sure that your baby fits into the car seat safely. °· Ask your health care provider any questions you have about caring for yourself or your baby. Make sure that you are able to contact your health care provider with any questions after leaving the hospital. °This information is not intended to replace advice given to you by your health care provider. Make sure you discuss any questions you have with your health care provider. °Document Released: 05/14/2007 Document Revised: 12/20/2015 Document Reviewed: 06/21/2015 °Elsevier Interactive Patient Education © 2018 [employer] Inc[employer] ° °

## 2017-07-23 NOTE — Discharge Summary (Signed)
OB Discharge Summary     Patient Name: Kathryn PardonDeMia P Pacha DOB: 11/13/1995 MRN: [id number]  Date of admission: 07/21/2017 Delivering MD: EARLY, Corrie DandyMARY C   Date of discharge: 07/23/2017  Admitting diagnosis: 39 WKS, LEAKING Intrauterine pregnancy: 7317w5d     Secondary diagnosis:  Active Problems:   Normal labor  Additional problems: n/a     Discharge diagnosis: Term Pregnancy Delivered                                                                                                Post partum procedures:n/a  Augmentation: Pitocin  Complications: None  Hospital course:  Onset of Labor With Vaginal Delivery     21 y.o. yo G2P1011 at 2517w5d was admitted in Latent Labor on 07/21/2017. Patient had an uncomplicated labor course as follows:  Membrane Rupture Time/Date: 2:00 AM ,07/21/2017   Intrapartum Procedures: Episiotomy: None [1]                                         Lacerations:  None [1]  Patient had a delivery of a Viable infant. 07/22/2017  Information for the patient's newborn:  Karna DupesHill, [facility] [[id number]][[id number]]  Delivery Method: Vaginal, Spontaneous(Filed from Delivery Summary)    Pateint had an uncomplicated postpartum course.  She is ambulating, tolerating a regular diet, passing flatus, and urinating well. Patient is discharged home in stable condition on 07/23/17.   Physical exam  Vitals:   07/22/17 1556 07/22/17 1824 07/22/17 2212 07/23/17 0612  BP: 119/71 121/67 103/65 (!) 96/53  Pulse: (!) 119 94 (!) 103 76  Resp:    18  Temp: 99.7 F (37.6 C) 99.7 F (37.6 C) 98.8 F (37.1 C) 98.4 F (36.9 C)  TempSrc: Axillary Oral Oral Axillary  SpO2:      Weight:    153 lb (69.4 kg)  Height:       General: alert, cooperative and no distress Lochia: appropriate Uterine Fundus: firm Incision: N/A DVT Evaluation: No evidence of DVT seen on physical exam. Labs: Lab Results  Component Value Date   WBC 8.2 07/21/2017   HGB 12.6 07/21/2017   HCT 36.3 07/21/2017   MCV 87.1  07/21/2017   PLT 216 07/21/2017   CMP Latest Ref Rng & Units 02/18/2017  Glucose 65 - 99 mg/dL 87  BUN 6 - 20 mg/dL 7  Creatinine 0.860.44 - 5.781.00 mg/dL 4.690.59  Sodium 629135 - 528145 mmol/L 134(L)  Potassium 3.5 - 5.1 mmol/L 3.7  Chloride 101 - 111 mmol/L 105  CO2 22 - 32 mmol/L 24  Calcium 8.9 - 10.3 mg/dL 9.2  Total Protein 6.5 - 8.1 g/dL -  Total Bilirubin 0.3 - 1.2 mg/dL -  Alkaline Phos 38 - 413126 U/L -  AST 15 - 41 U/L -  ALT 14 - 54 U/L -    Discharge instruction: per After Visit Summary and "Baby and Me Booklet".  After visit meds:  Allergies as of 07/23/2017   No Known Allergies  Medication List    STOP taking these medications   ondansetron 4 MG tablet Commonly known as:  ZOFRAN     TAKE these medications   ibuprofen 600 MG tablet Commonly known as:  ADVIL,MOTRIN Take 1 tablet (600 mg total) by mouth every 6 (six) hours.   prenatal multivitamin Tabs tablet Take 1 tablet by mouth daily at 12 noon.       Diet: routine diet  Activity: Advance as tolerated. Pelvic rest for 6 weeks.   Outpatient follow up:4 weeks Follow up Appt: Future Appointments  Date Time Provider Department Center  07/25/2017 11:00 AM Rolm BookbinderMoss, Amber, DO [facility]   Follow up Visit:No Follow-up on file.  Postpartum contraception: IUD Mirena  Newborn Data: Live born female  Birth Weight: 7 lb 4.2 oz (3294 g) APGAR: 9, 9  Newborn Delivery   Birth date/time:  07/22/2017 08:21:00 Delivery type:  Vaginal, Spontaneous     Baby Feeding: Bottle Disposition:home with mother   07/23/2017 Rolm Bookbinderaroline M Neill, CNM

## 2017-07-23 NOTE — Lactation Note (Signed)
This note was copied from a baby's chart. Lactation Consultation Note: Initial visit. Baby now 3124 hours old. Mom reports he has been latching well. [facility] Had been nursing before visit. Mom using cradle hold. Reviewed wide open mouth and keeping the baby close to the breast throughout the feeding. Mom easily able to hand express Colostrum. Baby latched well with several good swallows noted. Suggested football hold at next feeding. Then getting sleepy- still at the breast as I left room. BF brochure given. Reviewed our phone number, OP appointments and BFSG as resources for support after DC.  Patient Name: Kathryn Rojelio BrennerDeMia Osuna YQMVH'QToday's Date: 07/23/2017 Reason for consult: Initial assessment   Maternal Data Formula Feeding for Exclusion: No Has patient been taught Hand Expression?: Yes Does the patient have breastfeeding experience prior to this delivery?: No  Feeding Feeding Type: Breast Fed Length of feed: 10 min  LATCH Score Latch: Grasps breast easily, tongue down, lips flanged, rhythmical sucking.  Audible Swallowing: A few with stimulation  Type of Nipple: Everted at rest and after stimulation  Comfort (Breast/Nipple): Soft / non-tender  Hold (Positioning): Assistance needed to correctly position infant at breast and maintain latch.  LATCH Score: 8  Interventions Interventions: Breast feeding basics reviewed;Assisted with latch;Breast compression;Hand express  Lactation Tools Discussed/Used     Consult Status Consult Status: Follow-up Date: 07/24/17 Follow-up type: In-patient    Pamelia HoitWeeks, Ameera Tigue D 07/23/2017, 9:19 AM

## 2017-07-24 ENCOUNTER — Ambulatory Visit: Payer: Self-pay

## 2017-07-24 NOTE — Lactation Note (Signed)
This note was copied from a baby's chart. Lactation Consultation Note  Patient Name: Kathryn Rojelio BrennerDeMia Steib ZOXWR'UToday's Date: 07/24/2017 Reason for consult: Follow-up assessment;Hyperbilirubinemia Mom reports baby is latching easily.  Observed mom latch baby independently.  Baby feeding actively.  Discussed pumping with symphony pump in addition to breastfeeding to enhance milk supply.  Pump set up and initiated.  Instructed to post pump every 2-3 hours and give any expressed milk back to baby.  Encouraged to call out for assist/concerns.  Maternal Data    Feeding Feeding Type: Breast Fed  LATCH Score Latch: Grasps breast easily, tongue down, lips flanged, rhythmical sucking.  Audible Swallowing: A few with stimulation  Type of Nipple: Everted at rest and after stimulation  Comfort (Breast/Nipple): Soft / non-tender  Hold (Positioning): No assistance needed to correctly position infant at breast.  LATCH Score: 9  Interventions    Lactation Tools Discussed/Used Pump Review: Setup, frequency, and cleaning;Milk Storage Initiated by:: LM Date initiated:: 07/24/17   Consult Status Consult Status: Follow-up Date: 07/25/17 Follow-up type: In-patient    Huston FoleyMOULDEN, [name] 07/24/2017, 11:01 AM

## 2017-07-24 NOTE — Lactation Note (Addendum)
This note was copied from a baby's chart. Lactation Consultation Note  Patient Name: Kathryn Silva Today's Date: 07/24/2017    P1, Baby 56 hours old and on phototherapy.   Reviewed how to use DEBP.  Mother pumped for a few minutes and then baby began to cue. Mother easily latched baby in side lying position with lights on.  Sucks and swallows observed. Discussed milk storage,cleaning, spoon feeding and finger syringe feeding. Mother is frequently breastfeeding and has little time to fit in pumping. Encouraged mother to try pumping if baby is sleeping. Mom encouraged to feed baby 8-12 times/24 hours and with feeding cues.       Maternal Data    Feeding Feeding Type: Breast Fed Length of feed: 15 min  LATCH Score                   Interventions    Lactation Tools Discussed/Used     Consult Status      Kathryn Silva, Kathryn Silva 07/24/2017, 5:09 PM

## 2017-07-25 ENCOUNTER — Ambulatory Visit: Payer: Self-pay

## 2017-07-25 ENCOUNTER — Encounter: Payer: Self-pay | Admitting: General Practice

## 2017-07-25 ENCOUNTER — Encounter: Payer: Medicaid Other | Admitting: Family Medicine

## 2017-07-25 NOTE — Lactation Note (Signed)
This note was copied from a baby's chart. Lactation Consultation Note  Patient Name: Kathryn Silva QUIVH'O Date: 07/25/2017 Reason for consult: Follow-up assessment;Term;Hyperbilirubinemia(photo D/C last evening )  Baby is 89 hours old,  LC reviewed and updated the doc flow sheets per mom Baby is S/P photo tx, and presently latched.  [place] asked mom to check the latch for depth and noted the baby's lips to be disorganized, and latch to be shallow.  Baby released after 8 mins of feeding. Baby still hungry, LC assisted mom to obtain the depth and to obtain the depth.  Baby fed for 17 mins, softened breast,per mom comfortable. And mom pumping the right breast with DEBP with results And the right breast was cooling down with pumping.  Per mom my breast feel so much better.  LC reviewed hand expressing, and the importance if breast really full to start to release down with hand expressing or using the  Hand pump so baby can latch with depth.  Mom denies sore nipples. Sore nipple and engorgement prevention and tx reviewed.  Mom will have hand pump and the DEBP kit for D/C. Per mom active with [facility] and aware they are a resource.  LC discussed nutritive vs non-nutritive feeding patterns, and to watch for hanging out latched. LC recommended STS feedings until the baby can stay awake for feedings and back up to birth weight.  LC also mentioned to mom if the baby feeds on the 1st breast, softens it well, and is sound asleep, release with hand expressing or pumping, save milk and freeze.  Mom receptive to teaching , and review.  Mother informed of post-discharge support and given phone number to the lactation department, including services for phone call assistance; out-patient appointments; and breastfeeding support group. List of other breastfeeding resources in the community given in the handout. Encouraged mother to call for problems or concerns related to breastfeeding.     Maternal Data Has patient  been taught Hand Expression?: Yes  Feeding Feeding Type: Breast Milk Length of feed: (multiple swallows, gulps )  LATCH Score Latch: Grasps breast easily, tongue down, lips flanged, rhythmical sucking.  Audible Swallowing: Spontaneous and intermittent  Type of Nipple: Everted at rest and after stimulation  Comfort (Breast/Nipple): Filling, red/small blisters or bruises, mild/mod discomfort  Hold (Positioning): Assistance needed to correctly position infant at breast and maintain latch.  LATCH Score: 8  Interventions Interventions: Breast feeding basics reviewed;Assisted with latch;Skin to skin;Breast massage;Hand express;Breast compression;Adjust position;Support pillows;Position options;Expressed milk  Lactation Tools Discussed/Used Tools: Pump;Shells Shell Type: Inverted Breast pump type: Manual;Double-Electric Breast Pump WIC Program: Yes Pump Review: Milk Storage   Consult Status Consult Status: Complete Date: 07/25/17 Follow-up type: In-patient    [place] 07/25/2017, 10:27 AM

## 2017-08-02 ENCOUNTER — Telehealth: Payer: Self-pay | Admitting: General Practice

## 2017-08-02 NOTE — Telephone Encounter (Signed)
Called patient & informed her of pp visit and need for TOC that day. Patient verbalized understanding and had no questions

## 2017-08-03 ENCOUNTER — Ambulatory Visit: Payer: Medicaid Other

## 2017-08-06 ENCOUNTER — Encounter: Payer: Medicaid Other | Admitting: Advanced Practice Midwife

## 2017-08-27 ENCOUNTER — Other Ambulatory Visit (HOSPITAL_COMMUNITY)
Admission: RE | Admit: 2017-08-27 | Discharge: 2017-08-27 | Disposition: A | Discharge status: Home / Self Care | Payer: BLUE CROSS/BLUE SHIELD | Source: Ambulatory Visit | Attending: Advanced Practice Midwife | Admitting: Advanced Practice Midwife

## 2017-08-27 ENCOUNTER — Encounter: Payer: Self-pay | Admitting: Advanced Practice Midwife

## 2017-08-27 ENCOUNTER — Ambulatory Visit (INDEPENDENT_AMBULATORY_CARE_PROVIDER_SITE_OTHER): Payer: Medicaid Other | Admitting: Advanced Practice Midwife

## 2017-08-27 DIAGNOSIS — Z3049 Encounter for surveillance of other contraceptives: Secondary | ICD-10-CM | POA: Diagnosis present

## 2017-08-27 DIAGNOSIS — Z3009 Encounter for other general counseling and advice on contraception: Secondary | ICD-10-CM

## 2017-08-27 DIAGNOSIS — Z30017 Encounter for initial prescription of implantable subdermal contraceptive: Secondary | ICD-10-CM

## 2017-08-27 DIAGNOSIS — Z1389 Encounter for screening for other disorder: Secondary | ICD-10-CM | POA: Diagnosis not present

## 2017-08-27 LAB — POCT PREGNANCY, URINE: Preg Test, Ur: NEGATIVE

## 2017-08-27 MED ORDER — ETONOGESTREL 68 MG ~~LOC~~ IMPL
68.0000 mg | DRUG_IMPLANT | Freq: Once | SUBCUTANEOUS | Status: AC
  Administered 2017-08-27: 68 mg via SUBCUTANEOUS

## 2017-08-27 MED ORDER — CLOTRIMAZOLE 1 % EX CREA: 1.0000 "application " | TOPICAL_CREAM | CUTANEOUS | 4 refills | Status: DC

## 2017-08-27 NOTE — Progress Notes (Signed)
SVE 07/22/17. States is doing well except baby has thrush and thinks may have yeast on nipples. Cannot remember types of birthcontrol that are options since she is breastfeeding but would like to discuss

## 2017-08-27 NOTE — Patient Instructions (Signed)
Nexplanon Instructions After Insertion   Keep bandage clean and dry for 24 hours   May use ice/Tylenol/Ibuprofen for soreness or pain   If you develop fever, drainage or increased warmth from incision site-contact office immediately  Etonogestrel implant What is this medicine? ETONOGESTREL (et oh noe JES trel) is a contraceptive (birth control) device. It is used to prevent pregnancy. It can be used for up to 3 years. This medicine may be used for other purposes; ask your health care provider or pharmacist if you have questions. COMMON BRAND NAME(S): Implanon, Nexplanon What should I tell my health care provider before I take this medicine? They need to know if you have any of these conditions: -abnormal vaginal bleeding -blood vessel disease or blood clots -cancer of the breast, cervix, or liver -depression -diabetes -gallbladder disease -headaches -heart disease or recent heart attack -high blood pressure -high cholesterol -kidney disease -liver disease -renal disease -seizures -tobacco smoker -an unusual or allergic reaction to etonogestrel, other hormones, anesthetics or antiseptics, medicines, foods, dyes, or preservatives -pregnant or trying to get pregnant -breast-feeding How should I use this medicine? This device is inserted just under the skin on the inner side of your upper arm by a health care professional. Talk to your pediatrician regarding the use of this medicine in children. Special care may be needed. Overdosage: If you think you have taken too much of this medicine contact a poison control center or emergency room at once. NOTE: This medicine is only for you. Do not share this medicine with others. What if I miss a dose? This does not apply. What may interact with this medicine? Do not take this medicine with any of the following medications: -amprenavir -bosentan -fosamprenavir This medicine may also interact with the following  medications: -barbiturate medicines for inducing sleep or treating seizures -certain medicines for fungal infections like ketoconazole and itraconazole -grapefruit juice -griseofulvin -medicines to treat seizures like carbamazepine, felbamate, oxcarbazepine, phenytoin, topiramate -modafinil -phenylbutazone -rifampin -rufinamide -some medicines to treat HIV infection like atazanavir, indinavir, lopinavir, nelfinavir, tipranavir, ritonavir -St. John's wort This list may not describe all possible interactions. Give your health care provider a list of all the medicines, herbs, non-prescription drugs, or dietary supplements you use. Also tell them if you smoke, drink alcohol, or use illegal drugs. Some items may interact with your medicine. What should I watch for while using this medicine? This product does not protect you against HIV infection (AIDS) or other sexually transmitted diseases. You should be able to feel the implant by pressing your fingertips over the skin where it was inserted. Contact your doctor if you cannot feel the implant, and use a non-hormonal birth control method (such as condoms) until your doctor confirms that the implant is in place. If you feel that the implant may have broken or become bent while in your arm, contact your healthcare provider. What side effects may I notice from receiving this medicine? Side effects that you should report to your doctor or health care professional as soon as possible: -allergic reactions like skin rash, itching or hives, swelling of the face, lips, or tongue -breast lumps -changes in emotions or moods -depressed mood -heavy or prolonged menstrual bleeding -pain, irritation, swelling, or bruising at the insertion site -scar at site of insertion -signs of infection at the insertion site such as fever, and skin redness, pain or discharge -signs of pregnancy -signs and symptoms of a blood clot such as breathing problems; changes in  vision; chest pain; severe,   sudden headache; pain, swelling, warmth in the leg; trouble speaking; sudden numbness or weakness of the face, arm or leg -signs and symptoms of liver injury like dark yellow or brown urine; general ill feeling or flu-like symptoms; light-colored stools; loss of appetite; nausea; right upper belly pain; unusually weak or tired; yellowing of the eyes or skin -unusual vaginal bleeding, discharge -signs and symptoms of a stroke like changes in vision; confusion; trouble speaking or understanding; severe headaches; sudden numbness or weakness of the face, arm or leg; trouble walking; dizziness; loss of balance or coordination Side effects that usually do not require medical attention (report to your doctor or health care professional if they continue or are bothersome): -acne -back pain -breast pain -changes in weight -dizziness -general ill feeling or flu-like symptoms -headache -irregular menstrual bleeding -nausea -sore throat -vaginal irritation or inflammation This list may not describe all possible side effects. Call your doctor for medical advice about side effects. You may report side effects to FDA at 1-800-FDA-1088. Where should I keep my medicine? This drug is given in a hospital or clinic and will not be stored at home. NOTE: This sheet is a summary. It may not cover all possible information. If you have questions about this medicine, talk to your doctor, pharmacist, or health care provider.  2018 Elsevier/Gold Standard (2016-02-03 11:19:22)  

## 2017-08-27 NOTE — Addendum Note (Signed)
Addended by: Henrietta DineNEAL, Maicy Filip S on: 08/27/2017 04:26 PM   Modules accepted: Orders

## 2017-08-27 NOTE — Progress Notes (Signed)
Subjective:     Kathryn Silva is a 22 y.o. female who presents for a postpartum visit. She is 6 weeks postpartum following a spontaneous vaginal delivery. I have fully reviewed the prenatal and intrapartum course. The delivery was at 39 gestational weeks. Outcome: spontaneous vaginal delivery. Anesthesia: none. Postpartum course has been uncomplicated. Baby's course has been uncomplicated, baby has thrush, and she has not been breasfeed. She has been pumping and bottle feeding. She using Nystatin liquid in baby's mouth. No medication for mom has been used. Baby is feeding by breast. Bleeding no bleeding. Bowel function is normal. Bladder function is normal. Patient is not sexually active. Contraception method is none. Postpartum depression screening: negative.  She had +chamydia on 07/16/18. She will need TOC today.   The following portions of the patient's history were reviewed and updated as appropriate: allergies, current medications, past family history, past medical history, past social history, past surgical history and problem list.  Review of Systems Pertinent items are noted in HPI.   Objective:    BP 127/65 (BP Location: Left Arm, Patient Position: Sitting, Cuff Size: Normal)   Wt 145 lb 11.2 oz (66.1 kg)   Breastfeeding? Yes   BMI 29.43 kg/m   General:  alert and cooperative   Breasts:  negative  Lungs: clear to auscultation bilaterally  Heart:  regular rate and rhythm  Abdomen: soft, non-tender; bowel sounds normal; no masses,  no organomegaly   Vulva:  not evaluated  Vagina: not evaluated  Cervix:  not evaluated   Corpus: not examined  Adnexa:  not evaluated  Rectal Exam: Not performed.         GYNECOLOGY OFFICE PROCEDURE NOTE  Kathryn Silva is a 22 y.o. G2P1011 here for  Nexplanon insertion.  Last pap smear was on: patient has not had one due to age.  No other gynecologic concerns.  Nexplanon Insertion Procedure Patient identified, informed consent performed, consent  signed.  Patient does understand that irregular bleeding is a very common side effect of this medication. She was advised to have backup contraception for one week after placement. Pregnancy test in clinic today was negative.  Appropriate time out taken.  Patient's left arm was prepped and draped in the usual sterile fashion. The ruler used to measure and mark insertion area.  Patient was prepped with alcohol swab and then injected with 3 ml of 1% lidocaine.  She was prepped with betadine, Nexplanon removed from packaging,  Device confirmed in needle, then inserted full length of needle and withdrawn per handbook instructions. Nexplanon was able to palpated in the patient's arm; patient palpated the insert herself. There was minimal blood loss.  Patient insertion site covered with guaze and a pressure bandage to reduce any bruising.  The patient tolerated the procedure well and was given post procedure instructions.   Kathryn Silva 4:01 PM 08/27/17   Assessment:     Routine PP postpartum exam. Pap smear not done at today's visit.   Plan:    1. Contraception: Nexplanon 2. GC/CT TOC today 3. Follow up in: 1 year or as needed.   4. RX sent to pharmacy on file for clotrimazole to apply to nipples after feedings for thrush

## 2017-08-28 ENCOUNTER — Encounter: Payer: Self-pay | Admitting: *Deleted

## 2017-08-28 LAB — CERVICOVAGINAL ANCILLARY ONLY
Chlamydia: NEGATIVE
Neisseria Gonorrhea: NEGATIVE

## 2017-09-07 ENCOUNTER — Encounter: Payer: Self-pay | Admitting: Family Medicine

## 2017-11-02 ENCOUNTER — Emergency Department (HOSPITAL_COMMUNITY)
Admission: EM | Admit: 2017-11-02 | Discharge: 2017-11-02 | Disposition: A | Discharge status: Home / Self Care | Payer: BLUE CROSS/BLUE SHIELD | Attending: Emergency Medicine | Admitting: Emergency Medicine

## 2017-11-02 ENCOUNTER — Other Ambulatory Visit: Payer: Self-pay

## 2017-11-02 ENCOUNTER — Encounter (HOSPITAL_COMMUNITY): Payer: Self-pay | Admitting: Emergency Medicine

## 2017-11-02 DIAGNOSIS — J45909 Unspecified asthma, uncomplicated: Secondary | ICD-10-CM | POA: Diagnosis not present

## 2017-11-02 DIAGNOSIS — Z79899 Other long term (current) drug therapy: Secondary | ICD-10-CM | POA: Insufficient documentation

## 2017-11-02 DIAGNOSIS — J029 Acute pharyngitis, unspecified: Secondary | ICD-10-CM | POA: Diagnosis present

## 2017-11-02 DIAGNOSIS — J020 Streptococcal pharyngitis: Secondary | ICD-10-CM | POA: Insufficient documentation

## 2017-11-02 LAB — RAPID STREP SCREEN (MED CTR MEBANE ONLY): Streptococcus, Group A Screen (Direct): POSITIVE — AB

## 2017-11-02 MED ORDER — DEXAMETHASONE 1 MG/ML PO CONC
10.0000 mg | Freq: Once | ORAL | Status: AC
  Administered 2017-11-02: 10 mg via ORAL
  Filled 2017-11-02: qty 10

## 2017-11-02 MED ORDER — AMOXICILLIN 250 MG PO CHEW
500.0000 mg | CHEWABLE_TABLET | Freq: Once | ORAL | Status: DC
  Filled 2017-11-02: qty 2

## 2017-11-02 MED ORDER — PHENOL 1.4 % MT LIQD: 1.0000 | OROMUCOSAL | 0 refills | Status: DC | PRN

## 2017-11-02 MED ORDER — AMOXICILLIN 500 MG PO CAPS
500.0000 mg | ORAL_CAPSULE | Freq: Once | ORAL | Status: AC
  Administered 2017-11-02: 500 mg via ORAL
  Filled 2017-11-02: qty 1

## 2017-11-02 MED ORDER — AMOXICILLIN 500 MG PO CAPS: 500.0000 mg | ORAL_CAPSULE | Freq: Two times a day (BID) | ORAL | 0 refills | Status: AC

## 2017-11-02 MED ORDER — ACETAMINOPHEN 325 MG PO TABS
650.0000 mg | ORAL_TABLET | Freq: Once | ORAL | Status: AC
  Administered 2017-11-02: 650 mg via ORAL
  Filled 2017-11-02: qty 2

## 2017-11-02 MED ORDER — AMOXICILLIN 250 MG/5ML PO SUSR
500.0000 mg | Freq: Once | ORAL | Status: DC
  Filled 2017-11-02: qty 10

## 2017-11-02 NOTE — Discharge Instructions (Addendum)
As discussed, take your entire course of antibiotics even if you feel better.  Make sure that you stay well-hydrated drinking enough fluids to keep your urine clear.  The medication given here will help with swelling for the next 48 hours.  You may take Tylenol and ibuprofen to help with pain and swelling thereafter.  Drink tea with honey or other preferred warm beverage, use cough drops, throat spray to help soothe your throat.  Follow up with your primary care provider. Return sooner if symptoms worsen, difficulty swallowing, drooling, difficulty breathing, facial swelling, fever, nausea vomiting or other new concerning symptoms in the meantime.

## 2017-11-02 NOTE — ED Notes (Signed)
PT states understanding of care given, follow up care, and medication prescribed. PT ambulated from ED to car with a steady gait. 

## 2017-11-02 NOTE — ED Provider Notes (Signed)
[facility] EMERGENCY DEPARTMENT Provider Note   CSN: [id number] Arrival date & time: 11/02/17  1634     History   Chief Complaint Chief Complaint  Patient presents with  . Sore Throat    HPI Kathryn Silva is a 22 y.o. female with past medical history of asthma presenting with 1 week of sore throat, subjective fever and chills and odynophagia.  Patient denies cough, congestion. She has not taken anything for the symptoms.  Denies nausea vomiting.  HPI  Past Medical History:  Diagnosis Date  . Asthma   . History of one miscarriage 09/26/2016   08/2016: 11-12wks. No PNC. Came into OB triage VB, pain and BBOW. See surg path for fetus (+abnormalities). Needed D&C for rPOCs. Negative KB, TSH, rpr, hiv, cbc, GC/CT, wet prep, U/a, UDS.     Patient Active Problem List   Diagnosis Date Noted  . Normal labor 07/21/2017  . Supervision of other normal pregnancy, antepartum 03/19/2017  . History of first trimester miscarriage, currently pregnant 09/26/2016    Past Surgical History:  Procedure Laterality Date  . DILATION AND EVACUATION N/A 09/22/2016   Procedure: SUCTION DILATATION AND EVACUATION;  Surgeon: Waldron Bing, MD;  Location: [facility];  Service: Gynecology;  Laterality: N/A;     OB History    Gravida  2   Para  1   Term  1   Preterm      AB  1   Living  1     SAB  1   TAB      Ectopic      Multiple  0   Live Births  1            Home Medications    Prior to Admission medications   Medication Sig Start Date End Date Taking? Authorizing Provider  amoxicillin (AMOXIL) 500 MG capsule Take 1 capsule (500 mg total) by mouth 2 (two) times daily for 10 days. 11/02/17 11/12/17  Mathews Robinsons B, PA-C  clotrimazole (LOTRIMIN) 1 % cream Apply 1 application topically as directed. Apply after each feeding or pumping session. 08/27/17   Armando Reichert, CNM  ibuprofen (ADVIL,MOTRIN) 600 MG tablet Take 1 tablet (600 mg total) by mouth every 6  (six) hours. Patient not taking: Reported on 08/27/2017 07/23/17   Rolm Bookbinder, CNM  phenol ([employer] SORE THROAT SPRAY) 1.4 % LIQD Use as directed 1 spray in the mouth or throat as needed for throat irritation / pain. 11/02/17   Georgiana Shore, PA-C  Prenatal Vit-Fe Fumarate-FA (PRENATAL MULTIVITAMIN) TABS tablet Take 1 tablet by mouth daily at 12 noon.    [provider]    Family History Family History  Problem Relation Age of Onset  . Hypertension Mother     Social History Social History   Tobacco Use  . Smoking status: Never Smoker  . Smokeless tobacco: Never Used  Substance Use Topics  . Alcohol use: No  . Drug use: No     Allergies   Patient has no known allergies.   Review of Systems Review of Systems  Constitutional: Positive for chills and fever.  HENT: Positive for sore throat and trouble swallowing. Negative for congestion, drooling, facial swelling and voice change.   Respiratory: Negative for cough, choking, chest tightness, shortness of breath, wheezing and stridor.   Cardiovascular: Negative for chest pain and palpitations.  Gastrointestinal: Negative for abdominal pain, blood in stool, nausea and vomiting.  Musculoskeletal: Negative for arthralgias, myalgias,  neck pain and neck stiffness.  Skin: Negative for color change, pallor and rash.  Neurological: Negative for dizziness, light-headedness and headaches.     Physical Exam Updated Vital Signs BP 122/85   Pulse 100   Temp 98 F (36.7 C) (Oral)   Resp 16   SpO2 100%   Physical Exam  Constitutional: She appears well-developed and well-nourished.  Non-toxic appearance. She does not appear ill. No distress.  Afebrile, nontoxic-appearing, sitting comfortably in chair in no acute distress.  HENT:  Head: Normocephalic and atraumatic.  Mouth/Throat: Uvula is midline and mucous membranes are normal. Mucous membranes are not pale, not dry and not cyanotic. No uvula swelling. Posterior  oropharyngeal erythema present. No oropharyngeal exudate, posterior oropharyngeal edema or tonsillar abscesses. Tonsils are 2+ on the right. Tonsils are 2+ on the left. No tonsillar exudate.  Tonsillar edema without exudate.  Erythema to the oropharynx. No gross oral abscess. Uvula is midline, arches are simmetrical and intact. No peritonsilar swelling or exudate. No trismus. Sublingual mucosa is soft and non-tender. Tolerating oral secretions. No concern for ludwig's angina.   Eyes: Conjunctivae are normal.  Neck: Normal range of motion. Neck supple.  Cardiovascular: Normal rate and regular rhythm.  No murmur heard. Pulmonary/Chest: Effort normal and breath sounds normal. No respiratory distress. She has no wheezes. She has no rhonchi. She has no rales.  Abdominal: She exhibits no distension.  Musculoskeletal: She exhibits no edema.  Lymphadenopathy:    She has cervical adenopathy.  Neurological: She is alert.  Skin: Skin is warm and dry. No rash noted. No erythema.  Psychiatric: She has a normal mood and affect.  Nursing note and vitals reviewed.    ED Treatments / Results  Labs (all labs ordered are listed, but only abnormal results are displayed) Labs Reviewed  RAPID STREP SCREEN (NOT AT [facility]) - Abnormal; Notable for the following components:      Result Value   Streptococcus, Group A Screen (Direct) POSITIVE (*)    All other components within normal limits    EKG None  Radiology No results found.  Procedures Procedures (including critical care time)  Medications Ordered in ED Medications  dexamethasone (DECADRON) 1 MG/ML solution 10 mg (10 mg Oral Given 11/02/17 2000)  acetaminophen (TYLENOL) tablet 650 mg (650 mg Oral Given 11/02/17 2000)  amoxicillin (AMOXIL) capsule 500 mg (500 mg Oral Given 11/02/17 2038)     Initial Impression / Assessment and Plan / ED Course  I have reviewed the triage vital signs and the nursing notes.  Pertinent labs & imaging results that  were available during my care of the patient were reviewed by me and considered in my medical decision making (see chart for details).    Pt afebrile without tonsillar exudate, positive cervical lymphadenopathy, & odynophagia; diagnosis of strep. Treated in the Ed with steroids, NSAIDs, Pain medication and abx.  Pt appears doesn't dehydrated, discussed importance of water rehydration.  Presentation non concerning for PTA or infxn spread to soft tissue. No trismus or uvula deviation. Specific return precautions discussed. Pt able to drink water in ED without difficulty with intact airway.   Patient was given Decadron while in the emergency department, first dose amoxicillin and Tylenol and reported improvement on reassessment.  Will discharge home with amoxicillin, symptomatic relief and close follow-up with PCP.  Discussed strict return precautions and advised to return to the emergency department if experiencing any new or worsening symptoms. Instructions were understood and patient agreed with discharge plan.  Final  Clinical Impressions(s) / ED Diagnoses   Final diagnoses:  Strep throat    ED Discharge Orders        Ordered    amoxicillin (AMOXIL) 500 MG capsule  2 times daily     11/02/17 1833    phenol ([employer] SORE THROAT SPRAY) 1.4 % LIQD  As needed     11/02/17 1834       Georgiana ShoreMitchell, Ollen Rao B, PA-C 11/02/17 2105    Arby BarrettePfeiffer, Marcy, MD 11/09/17 [phone]

## 2017-11-02 NOTE — ED Triage Notes (Signed)
Pt states she has a very sore throat. The right tonsil is enlarged, painful to swallow. Pt's neck also feels sore on right side. Pt having trouble eating due to pain. Denies N/V. But feels like she has a fever.

## 2019-10-08 ENCOUNTER — Other Ambulatory Visit: Payer: Self-pay

## 2019-10-08 ENCOUNTER — Ambulatory Visit (HOSPITAL_COMMUNITY)
Admission: EM | Admit: 2019-10-08 | Discharge: 2019-10-08 | Disposition: A | Discharge status: Home / Self Care | Payer: Self-pay | Attending: Internal Medicine | Admitting: Internal Medicine

## 2019-10-08 ENCOUNTER — Encounter (HOSPITAL_COMMUNITY): Payer: Self-pay

## 2019-10-08 DIAGNOSIS — H1010 Acute atopic conjunctivitis, unspecified eye: Secondary | ICD-10-CM

## 2019-10-08 DIAGNOSIS — J309 Allergic rhinitis, unspecified: Secondary | ICD-10-CM

## 2019-10-08 MED ORDER — ALBUTEROL SULFATE HFA 108 (90 BASE) MCG/ACT IN AERS: 1.0000 | INHALATION_SPRAY | Freq: Four times a day (QID) | RESPIRATORY_TRACT | 0 refills | Status: DC | PRN

## 2019-10-08 MED ORDER — FLUTICASONE PROPIONATE 50 MCG/ACT NA SUSP: 1.0000 | Freq: Every day | NASAL | 1 refills | Status: DC

## 2019-10-08 MED ORDER — OLOPATADINE HCL 0.1 % OP SOLN: 1.0000 [drp] | Freq: Two times a day (BID) | OPHTHALMIC | 2 refills | Status: DC

## 2019-10-08 MED ORDER — CETIRIZINE HCL 10 MG PO TABS: 10.0000 mg | ORAL_TABLET | Freq: Every day | ORAL | 2 refills | Status: DC

## 2019-10-08 NOTE — ED Provider Notes (Signed)
[facility]    CSN: [id number] Arrival date & time: 10/08/19  1803      History   Chief Complaint Chief Complaint  Patient presents with  . Facial Pain    HPI Kathryn Silva is a 24 y.o. female with history of asthma comes to urgent care with complaints of itchy eyes and clear rhinorrhea with nasal itching of 1 week duration.  Patient has seasonal allergies which usually starts in late winter into early spring.  Patient is not taking any allergy medications at this time.  She denies any shortness of breath, cough or wheezing.  No chest pain or chest pressure.  She denies any ear pain or pressure.  No fever or chills.  No sick contacts.  No loss of taste or smell. HPI  Past Medical History:  Diagnosis Date  . Asthma   . History of one miscarriage 09/26/2016   08/2016: 11-12wks. No PNC. Came into OB triage VB, pain and BBOW. See surg path for fetus (+abnormalities). Needed D&C for rPOCs. Negative KB, TSH, rpr, hiv, cbc, GC/CT, wet prep, U/a, UDS.     Patient Active Problem List   Diagnosis Date Noted  . Normal labor 07/21/2017  . Supervision of other normal pregnancy, antepartum 03/19/2017  . History of first trimester miscarriage, currently pregnant 09/26/2016    Past Surgical History:  Procedure Laterality Date  . DILATION AND EVACUATION N/A 09/22/2016   Procedure: SUCTION DILATATION AND EVACUATION;  Surgeon: [place] Bing, MD;  Location: [facility];  Service: Gynecology;  Laterality: N/A;    OB History    Gravida  2   Para  1   Term  1   Preterm      AB  1   Living  1     SAB  1   TAB      Ectopic      Multiple  0   Live Births  1            Home Medications    Prior to Admission medications   Medication Sig Start Date End Date Taking? Authorizing Provider  albuterol (VENTOLIN HFA) 108 (90 Base) MCG/ACT inhaler Inhale 1-2 puffs into the lungs every 6 (six) hours as needed for wheezing or shortness of breath. 10/08/19   Merrilee Jansky,  MD  cetirizine (ZYRTEC ALLERGY) 10 MG tablet Take 1 tablet (10 mg total) by mouth daily. 10/08/19 11/07/19  Merrilee Jansky, MD  clotrimazole (LOTRIMIN) 1 % cream Apply 1 application topically as directed. Apply after each feeding or pumping session. 08/27/17   Armando Reichert, CNM  fluticasone (FLONASE) 50 MCG/ACT nasal spray Place 1 spray into both nostrils daily. 10/08/19   Jaelyn Cloninger, Britta Mccreedy, MD  olopatadine (PATANOL) 0.1 % ophthalmic solution Place 1 drop into both eyes 2 (two) times daily. 10/08/19   Zondra Lawlor, Britta Mccreedy, MD  phenol ([employer] SORE THROAT SPRAY) 1.4 % LIQD Use as directed 1 spray in the mouth or throat as needed for throat irritation / pain. 11/02/17   Georgiana Shore, PA-C  Prenatal Vit-Fe Fumarate-FA (PRENATAL MULTIVITAMIN) TABS tablet Take 1 tablet by mouth daily at 12 noon.    [provider]    Family History Family History  Problem Relation Age of Onset  . Hypertension Mother     Social History Social History   Tobacco Use  . Smoking status: Never Smoker  . Smokeless tobacco: Never Used  Substance Use Topics  . Alcohol use: No  .  Drug use: No     Allergies   Patient has no known allergies.   Review of Systems Review of Systems  Constitutional: Negative for activity change, chills and fever.  HENT: Positive for postnasal drip. Negative for dental problem, ear discharge, ear pain, hearing loss, rhinorrhea, sinus pressure, sinus pain and sneezing.   Eyes: Positive for discharge and itching. Negative for photophobia, pain, redness and visual disturbance.  Respiratory: Negative for cough, chest tightness and shortness of breath.   Gastrointestinal: Negative for nausea and vomiting.  Neurological: Negative for dizziness, facial asymmetry, light-headedness and headaches.     Physical Exam Triage Vital Signs ED Triage Vitals  Enc Vitals Group     BP 10/08/19 1859 (!) 147/80     Pulse Rate 10/08/19 1859 94     Resp 10/08/19 1859 16     Temp 10/08/19  1859 98.3 F (36.8 C)     Temp Source 10/08/19 1859 Oral     SpO2 10/08/19 1859 100 %     Weight 10/08/19 1857 152 lb (68.9 kg)     Height --      Head Circumference --      Peak Flow --      Pain Score 10/08/19 1857 0     Pain Loc --      Pain Edu? --      Excl. in GC? --    No data found.  Updated Vital Signs BP (!) 147/80 (BP Location: Right Arm)   Pulse 94   Temp 98.3 F (36.8 C) (Oral)   Resp 16   Wt 68.9 kg   LMP 10/08/2019   SpO2 100%   BMI 30.70 kg/m   Visual Acuity Right Eye Distance:   Left Eye Distance:   Bilateral Distance:    Right Eye Near:   Left Eye Near:    Bilateral Near:     Physical Exam Vitals and nursing note reviewed.  Constitutional:      General: She is not in acute distress.    Appearance: She is not ill-appearing.  HENT:     Right Ear: Tympanic membrane normal.     Left Ear: Tympanic membrane normal.     Nose: Congestion and rhinorrhea present.     Mouth/Throat:     Mouth: Mucous membranes are moist.     Pharynx: No oropharyngeal exudate or posterior oropharyngeal erythema.  Eyes:     Comments: Vernal conjunctivitis  Cardiovascular:     Rate and Rhythm: Normal rate and regular rhythm.  Pulmonary:     Effort: Pulmonary effort is normal. No respiratory distress.     Breath sounds: Normal breath sounds. No stridor. No wheezing or rhonchi.  Musculoskeletal:     Cervical back: Normal range of motion and neck supple. No tenderness.  Lymphadenopathy:     Cervical: No cervical adenopathy.  Neurological:     Mental Status: She is alert.      UC Treatments / Results  Labs (all labs ordered are listed, but only abnormal results are displayed) Labs Reviewed - No data to display  EKG   Radiology No results found.  Procedures Procedures (including critical care time)  Medications Ordered in UC Medications - No data to display  Initial Impression / Assessment and Plan / UC Course  I have reviewed the triage vital signs and  the nursing notes.  Pertinent labs & imaging results that were available during my care of the patient were reviewed by me and considered in  my medical decision making (see chart for details).     1.  Allergic conjunctivitis and rhinitis: Fluticasone nasal spray Zyrtec 10 mg tablets daily Patanol ophthalmic solution twice daily Albuterol inhaler as needed for wheezing If patient has worsening symptoms she is advised to return to urgent care to be reevaluated. Final Clinical Impressions(s) / UC Diagnoses   Final diagnoses:  Allergic conjunctivitis and rhinitis, unspecified laterality   Discharge Instructions   None    ED Prescriptions    Medication Sig Dispense Auth. Provider   fluticasone (FLONASE) 50 MCG/ACT nasal spray Place 1 spray into both nostrils daily. 16 g Chase Picket, MD   cetirizine (ZYRTEC ALLERGY) 10 MG tablet Take 1 tablet (10 mg total) by mouth daily. 30 tablet Takoya Jonas, Myrene Galas, MD   olopatadine (PATANOL) 0.1 % ophthalmic solution Place 1 drop into both eyes 2 (two) times daily. 5 mL Ross Hefferan, Myrene Galas, MD   albuterol (VENTOLIN HFA) 108 (90 Base) MCG/ACT inhaler Inhale 1-2 puffs into the lungs every 6 (six) hours as needed for wheezing or shortness of breath. 18 g Gerritt Galentine, Myrene Galas, MD     PDMP not reviewed this encounter.   Chase Picket, MD 10/08/19 [phone]

## 2019-10-08 NOTE — ED Triage Notes (Signed)
Pt states she has sinus drainage  This has been going on for a week .

## 2019-11-27 ENCOUNTER — Other Ambulatory Visit: Payer: Self-pay

## 2019-11-27 ENCOUNTER — Encounter (HOSPITAL_COMMUNITY): Payer: Self-pay

## 2019-11-27 ENCOUNTER — Emergency Department (HOSPITAL_COMMUNITY)
Admission: EM | Admit: 2019-11-27 | Discharge: 2019-11-27 | Disposition: A | Discharge status: Home / Self Care | Payer: BLUE CROSS/BLUE SHIELD | Attending: Emergency Medicine | Admitting: Emergency Medicine

## 2019-11-27 DIAGNOSIS — J020 Streptococcal pharyngitis: Secondary | ICD-10-CM | POA: Insufficient documentation

## 2019-11-27 DIAGNOSIS — Z20822 Contact with and (suspected) exposure to covid-19: Secondary | ICD-10-CM | POA: Insufficient documentation

## 2019-11-27 DIAGNOSIS — M7918 Myalgia, other site: Secondary | ICD-10-CM | POA: Insufficient documentation

## 2019-11-27 LAB — POC SARS CORONAVIRUS 2 AG -  ED: SARS Coronavirus 2 Ag: NEGATIVE

## 2019-11-27 LAB — GROUP A STREP BY PCR: Group A Strep by PCR: DETECTED — AB

## 2019-11-27 MED ORDER — ACETAMINOPHEN 325 MG PO TABS
650.0000 mg | ORAL_TABLET | Freq: Once | ORAL | Status: AC
  Administered 2019-11-27: 650 mg via ORAL
  Filled 2019-11-27: qty 2

## 2019-11-27 MED ORDER — AMOXICILLIN 500 MG PO CAPS: 500.0000 mg | ORAL_CAPSULE | Freq: Two times a day (BID) | ORAL | 0 refills | Status: DC

## 2019-11-27 NOTE — ED Triage Notes (Signed)
Patient complains of feeling like throat swollen x 1 day. Patient states that she has allergies and works in a [employer], [employer]. NAD

## 2019-11-27 NOTE — Discharge Instructions (Signed)
Start Amoxicillin twice daily with food for strep infection. Take for full 10 days even if it's feeling better Take Ibuprofen or Tylenol for fever, chills, pain Follow up with ENT as needed

## 2019-11-27 NOTE — ED Provider Notes (Signed)
[facility] EMERGENCY DEPARTMENT Provider Note   CSN: [id number] Arrival date & time: 11/27/19  0746   History No chief complaint on file.   Kathryn Silva is a 24 y.o. female who presents with a sore throat. She states that she woke up this morning with a L sided sore throat and swelling. She reports associated chills and body aches. It hurts to swallow. Nothing makes it better. 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 She denies fever, headache, ear pain, runny nose, congestion, anosmia, cough, SOB, wheezing. No COVID contacts that she knows of. She denies hx of COVID or recent COVID testing. She does have a hx of strep throat.   HPI     Past Medical History:  Diagnosis Date  . Asthma   . History of one miscarriage 09/26/2016   08/2016: 11-12wks. No PNC. Came into OB triage VB, pain and BBOW. See surg path for fetus (+abnormalities). Needed D&C for rPOCs. Negative KB, TSH, rpr, hiv, cbc, GC/CT, wet prep, U/a, UDS.     Patient Active Problem List   Diagnosis Date Noted  . Normal labor 07/21/2017  . Supervision of other normal pregnancy, antepartum 03/19/2017  . History of first trimester miscarriage, currently pregnant 09/26/2016    Past Surgical History:  Procedure Laterality Date  . DILATION AND EVACUATION N/A 09/22/2016   Procedure: SUCTION DILATATION AND EVACUATION;  Surgeon: [place] Bing, MD;  Location: [facility];  Service: Gynecology;  Laterality: N/A;     OB History    Gravida  2   Para  1   Term  1   Preterm      AB  1   Living  1     SAB  1   TAB      Ectopic      Multiple  0   Live Births  1           Family History  Problem Relation Age of Onset  . Hypertension Mother     Social History   Tobacco Use  . Smoking status: Never Smoker  . Smokeless tobacco: Never Used  Substance Use Topics  . Alcohol use: No  . Drug use: No    Home Medications Prior to Admission  medications   Medication Sig Start Date End Date Taking? Authorizing Provider  albuterol (VENTOLIN HFA) 108 (90 Base) MCG/ACT inhaler Inhale 1-2 puffs into the lungs every 6 (six) hours as needed for wheezing or shortness of breath. 10/08/19   Merrilee Jansky, MD  cetirizine (ZYRTEC ALLERGY) 10 MG tablet Take 1 tablet (10 mg total) by mouth daily. 10/08/19 11/07/19  Merrilee Jansky, MD  clotrimazole (LOTRIMIN) 1 % cream Apply 1 application topically as directed. Apply after each feeding or pumping session. 08/27/17   Armando Reichert, CNM  fluticasone (FLONASE) 50 MCG/ACT nasal spray Place 1 spray into both nostrils daily. 10/08/19   Lamptey, Britta Mccreedy, MD  olopatadine (PATANOL) 0.1 % ophthalmic solution Place 1 drop into both eyes 2 (two) times daily. 10/08/19   Lamptey, Britta Mccreedy, MD  phenol ([employer] SORE THROAT SPRAY) 1.4 % LIQD Use as directed 1 spray in the mouth or throat as needed for throat irritation / pain. 11/02/17   Georgiana Shore, PA-C  Prenatal Vit-Fe Fumarate-FA (PRENATAL MULTIVITAMIN) TABS tablet Take 1 tablet by mouth daily at 12 noon.    [provider]    Allergies  Patient has no known allergies.  Review of Systems   Review of Systems  Constitutional: Positive for chills and fatigue. Negative for fever.  HENT: Positive for sore throat. Negative for congestion, rhinorrhea and trouble swallowing.   Respiratory: Negative for cough and shortness of breath.     Physical Exam Updated Vital Signs BP 119/68   Pulse 98   Temp 98.7 F (37.1 C) (Oral)   Resp 18   SpO2 100%   Physical Exam Vitals and nursing note reviewed.  Constitutional:      General: She is not in acute distress.    Appearance: Normal appearance. She is well-developed. She is not ill-appearing.     Comments: Calm and cooperative. Warm to touch  HENT:     Head: Normocephalic and atraumatic.     Right Ear: Tympanic membrane normal.     Left Ear: Tympanic membrane normal.     Nose: Nose normal.      Mouth/Throat:     Lips: Pink.     Mouth: Mucous membranes are moist.     Tongue: No lesions.     Pharynx: Oropharynx is clear. Posterior oropharyngeal erythema present.     Tonsils: No tonsillar exudate. 2+ on the right. 2+ on the left.  Eyes:     General: No scleral icterus.       Right eye: No discharge.        Left eye: No discharge.     Conjunctiva/sclera: Conjunctivae normal.     Pupils: Pupils are equal, round, and reactive to light.  Cardiovascular:     Rate and Rhythm: Normal rate.  Pulmonary:     Effort: Pulmonary effort is normal. No respiratory distress.  Abdominal:     General: There is no distension.  Musculoskeletal:     Cervical back: Normal range of motion.  Skin:    General: Skin is warm and dry.  Neurological:     Mental Status: She is alert and oriented to person, place, and time.  Psychiatric:        Behavior: Behavior normal.     ED Results / Procedures / Treatments   Labs (all labs ordered are listed, but only abnormal results are displayed) Labs Reviewed  GROUP A STREP BY PCR - Abnormal; Notable for the following components:      Result Value   Group A Strep by PCR DETECTED (*)    All other components within normal limits  POC SARS CORONAVIRUS 2 AG -  ED    EKG None  Radiology No results found.  Procedures Procedures (including critical care time)  Medications Ordered in ED Medications - No data to display  ED Course  I have reviewed the triage vital signs and the nursing notes.  Pertinent labs & imaging results that were available during my care of the patient were reviewed by me and considered in my medical decision making (see chart for details).  24 year old female presents with a sore throat.  She is noted to be febrile here with temp of 101.  Tonsils are enlarged bilaterally and she has mild erythema.  Patient has history of strep throat so will obtain this today and Covid test  Strep is positive.  Covid is negative.  Patient  was offered IM penicillin versus oral medication and she is opting for p.o.  Prescription given for amoxicillin 500 twice daily for 10 days and she was given an ENT referral per her request  Kathryn Silva was evaluated in Emergency  Department on 11/27/2019 for the symptoms described in the history of present illness. She was evaluated in the context of the global COVID-19 pandemic, which necessitated consideration that the patient might be at risk for infection with the SARS-CoV-2 virus that causes COVID-19. Institutional protocols and algorithms that pertain to the evaluation of patients at risk for COVID-19 are in a state of rapid change based on information released by regulatory bodies including the CDC and federal and state organizations. These policies and algorithms were followed during the patient's care in the ED.  MDM Rules/Calculators/A&P                       Final Clinical Impression(s) / ED Diagnoses Final diagnoses:  Strep pharyngitis    Rx / DC Orders ED Discharge Orders    None       Recardo Evangelist, PA-C 11/27/19 1335    Pattricia Boss, MD 11/27/19 [phone]

## 2020-03-22 ENCOUNTER — Other Ambulatory Visit: Payer: Self-pay

## 2020-03-22 ENCOUNTER — Emergency Department (HOSPITAL_COMMUNITY)
Admission: EM | Admit: 2020-03-22 | Discharge: 2020-03-22 | Disposition: A | Discharge status: Home / Self Care | Payer: Self-pay | Attending: Emergency Medicine | Admitting: Emergency Medicine

## 2020-03-22 ENCOUNTER — Encounter (HOSPITAL_BASED_OUTPATIENT_CLINIC_OR_DEPARTMENT_OTHER): Payer: Self-pay | Admitting: *Deleted

## 2020-03-22 ENCOUNTER — Emergency Department (HOSPITAL_BASED_OUTPATIENT_CLINIC_OR_DEPARTMENT_OTHER)
Admission: EM | Admit: 2020-03-22 | Discharge: 2020-03-22 | Disposition: A | Discharge status: Home / Self Care | Payer: Self-pay | Attending: Emergency Medicine | Admitting: Emergency Medicine

## 2020-03-22 ENCOUNTER — Emergency Department (HOSPITAL_BASED_OUTPATIENT_CLINIC_OR_DEPARTMENT_OTHER): Payer: Self-pay

## 2020-03-22 DIAGNOSIS — Z7951 Long term (current) use of inhaled steroids: Secondary | ICD-10-CM | POA: Insufficient documentation

## 2020-03-22 DIAGNOSIS — Z79899 Other long term (current) drug therapy: Secondary | ICD-10-CM | POA: Insufficient documentation

## 2020-03-22 DIAGNOSIS — D649 Anemia, unspecified: Secondary | ICD-10-CM | POA: Insufficient documentation

## 2020-03-22 DIAGNOSIS — Z5321 Procedure and treatment not carried out due to patient leaving prior to being seen by health care provider: Secondary | ICD-10-CM | POA: Insufficient documentation

## 2020-03-22 DIAGNOSIS — N939 Abnormal uterine and vaginal bleeding, unspecified: Secondary | ICD-10-CM | POA: Insufficient documentation

## 2020-03-22 DIAGNOSIS — J45909 Unspecified asthma, uncomplicated: Secondary | ICD-10-CM | POA: Insufficient documentation

## 2020-03-22 LAB — CBC
HCT: 30.1 % — ABNORMAL LOW (ref 36.0–46.0)
Hemoglobin: 9.3 g/dL — ABNORMAL LOW (ref 12.0–15.0)
MCH: 26 pg (ref 26.0–34.0)
MCHC: 30.9 g/dL (ref 30.0–36.0)
MCV: 84.1 fL (ref 80.0–100.0)
Platelets: 282 10*3/uL (ref 150–400)
RBC: 3.58 MIL/uL — ABNORMAL LOW (ref 3.87–5.11)
RDW: 14.3 % (ref 11.5–15.5)
WBC: 6.5 10*3/uL (ref 4.0–10.5)
nRBC: 0 % (ref 0.0–0.2)

## 2020-03-22 LAB — BASIC METABOLIC PANEL
Anion gap: 8 (ref 5–15)
BUN: 12 mg/dL (ref 6–20)
CO2: 22 mmol/L (ref 22–32)
Calcium: 9.1 mg/dL (ref 8.9–10.3)
Chloride: 107 mmol/L (ref 98–111)
Creatinine, Ser: 0.79 mg/dL (ref 0.44–1.00)
GFR calc Af Amer: >60 mL/min (ref >60)
GFR calc non Af Amer: >60 mL/min (ref >60)
Glucose, Bld: 135 mg/dL — ABNORMAL HIGH (ref 70–99)
Potassium: 3.8 mmol/L (ref 3.5–5.1)
Sodium: 137 mmol/L (ref 135–145)

## 2020-03-22 LAB — I-STAT BETA HCG BLOOD, ED (MC, WL, AP ONLY): I-stat hCG, quantitative: <5 m[IU]/mL (ref <5)

## 2020-03-22 LAB — PREGNANCY, URINE: Preg Test, Ur: NEGATIVE

## 2020-03-22 MED ORDER — IBUPROFEN 800 MG PO TABS
800.0000 mg | ORAL_TABLET | Freq: Once | ORAL | Status: AC
  Administered 2020-03-22: 18:00:00 800 mg via ORAL
  Filled 2020-03-22: qty 1

## 2020-03-22 MED ORDER — IBUPROFEN 600 MG PO TABS: 600.0000 mg | ORAL_TABLET | Freq: Three times a day (TID) | ORAL | 0 refills | Status: AC

## 2020-03-22 NOTE — ED Triage Notes (Signed)
Patient complains of 17 days of heavy vaginal bleeding with clots. Complains of abdominal cramping with same. Complains of fatigue with same.

## 2020-03-22 NOTE — ED Notes (Signed)
Pt. Report heavy bleeding for a week now.  Pt. Started per period a week ago and now as of Friday is bleeding very heavy with clotting.

## 2020-03-22 NOTE — Discharge Instructions (Signed)
For your vaginal bleeding, I would recommend a trial of NSAID therapy such as Motrin.  Take this as prescribed for the next week.  Is very important that you follow-up with an OB/GYN.  Please call the number provided to schedule a follow-up appointment.  If you have any increase in your vaginal bleeding, if you develop any symptoms such as lightheadedness, passing out, shortness of breath, chest pain, please return to the ER for reassessment at that time.

## 2020-03-22 NOTE — ED Notes (Signed)
Called for vitals and no response 

## 2020-03-22 NOTE — ED Provider Notes (Signed)
[facility] EMERGENCY DEPARTMENT Provider Note   CSN: [id number] Arrival date & time: 03/22/20  1422     History Chief Complaint  Patient presents with  . Vaginal Bleeding    Kathryn Silva is a 24 y.o. female.  Presents ER with concern for vaginal bleeding.  Has been having heavy bleeding for 2 and half weeks.  Reports that she uses Nexplanon and reports its been about 3 years since this was placed.  She denies any shortness of breath, no syncope, presyncope.  Occasionally has some mild intermittent central pelvic cramping, similar to period type pain.  She denies any other complaints.  Has any chronic medical conditions except for asthma. HPI     Past Medical History:  Diagnosis Date  . Asthma   . History of one miscarriage 09/26/2016   08/2016: 11-12wks. No PNC. Came into OB triage VB, pain and BBOW. See surg path for fetus (+abnormalities). Needed D&C for rPOCs. Negative KB, TSH, rpr, hiv, cbc, GC/CT, wet prep, U/a, UDS.     Patient Active Problem List   Diagnosis Date Noted  . Normal labor 07/21/2017  . Supervision of other normal pregnancy, antepartum 03/19/2017  . History of first trimester miscarriage, currently pregnant 09/26/2016    Past Surgical History:  Procedure Laterality Date  . DILATION AND EVACUATION N/A 09/22/2016   Procedure: SUCTION DILATATION AND EVACUATION;  Surgeon: [place] Bing, MD;  Location: [facility];  Service: Gynecology;  Laterality: N/A;     OB History    Gravida  2   Para  1   Term  1   Preterm      AB  1   Living  1     SAB  1   TAB      Ectopic      Multiple  0   Live Births  1           Family History  Problem Relation Age of Onset  . Hypertension Mother     Social History   Tobacco Use  . Smoking status: Never Smoker  . Smokeless tobacco: Never Used  Substance Use Topics  . Alcohol use: No  . Drug use: No    Home Medications Prior to Admission medications   Medication Sig Start Date End  Date Taking? Authorizing Provider  albuterol (VENTOLIN HFA) 108 (90 Base) MCG/ACT inhaler Inhale 1-2 puffs into the lungs every 6 (six) hours as needed for wheezing or shortness of breath. 10/08/19   Lamptey, Britta Mccreedy, MD  amoxicillin (AMOXIL) 500 MG capsule Take 1 capsule (500 mg total) by mouth 2 (two) times daily. 11/27/19   Bethel Born, PA-C  cetirizine (ZYRTEC ALLERGY) 10 MG tablet Take 1 tablet (10 mg total) by mouth daily. 10/08/19 11/07/19  Merrilee Jansky, MD  clotrimazole (LOTRIMIN) 1 % cream Apply 1 application topically as directed. Apply after each feeding or pumping session. 08/27/17   Armando Reichert, CNM  fluticasone (FLONASE) 50 MCG/ACT nasal spray Place 1 spray into both nostrils daily. 10/08/19   Lamptey, Britta Mccreedy, MD  ibuprofen (ADVIL) 600 MG tablet Take 1 tablet (600 mg total) by mouth 3 (three) times daily for 14 days. 03/22/20 04/05/20  Milagros Loll, MD  olopatadine (PATANOL) 0.1 % ophthalmic solution Place 1 drop into both eyes 2 (two) times daily. 10/08/19   Lamptey, Britta Mccreedy, MD  phenol ([employer] SORE THROAT SPRAY) 1.4 % LIQD Use as directed 1 spray in the mouth or throat as  needed for throat irritation / pain. 11/02/17   Georgiana Shore, PA-C  Prenatal Vit-Fe Fumarate-FA (PRENATAL MULTIVITAMIN) TABS tablet Take 1 tablet by mouth daily at 12 noon.    [provider]    Allergies    Patient has no known allergies.  Review of Systems   Review of Systems  Constitutional: Negative for chills and fever.  HENT: Negative for ear pain and sore throat.   Eyes: Negative for pain and visual disturbance.  Respiratory: Negative for cough and shortness of breath.   Cardiovascular: Negative for chest pain and palpitations.  Gastrointestinal: Negative for abdominal pain and vomiting.  Genitourinary: Positive for vaginal bleeding. Negative for dysuria and hematuria.  Musculoskeletal: Negative for arthralgias and back pain.  Skin: Negative for color change and rash.    Neurological: Negative for seizures and syncope.  All other systems reviewed and are negative.   Physical Exam Updated Vital Signs BP 116/79 (BP Location: Right Arm)   Pulse 71   Temp 98.5 F (36.9 C) (Oral)   Resp 16   Ht 4\' 11"  (1.499 m)   Wt 68 kg   SpO2 100%   BMI 30.30 kg/m   Physical Exam Vitals and nursing note reviewed. Exam conducted with a chaperone present.  Constitutional:      General: She is not in acute distress.    Appearance: She is well-developed.  HENT:     Head: Normocephalic and atraumatic.  Eyes:     Conjunctiva/sclera: Conjunctivae normal.  Cardiovascular:     Rate and Rhythm: Normal rate and regular rhythm.     Heart sounds: No murmur heard.   Pulmonary:     Effort: Pulmonary effort is normal. No respiratory distress.  Abdominal:     General: Abdomen is flat. There is no distension.     Palpations: Abdomen is soft.     Tenderness: There is no abdominal tenderness. There is no guarding or rebound.  Genitourinary:    Comments: Normal-appearing external genitalia, small blood in vagina, no active bleeding noted, normal-appearing cervix, no CMT, no discharge, no adnexal masses Musculoskeletal:     Cervical back: Neck supple.  Skin:    General: Skin is warm and dry.  Neurological:     Mental Status: She is alert.     ED Results / Procedures / Treatments   Labs (all labs ordered are listed, but only abnormal results are displayed) Labs Reviewed  PREGNANCY, URINE    EKG None  Radiology PELVIC COMPLETE W TRANSVAGINAL AND TORSION R/O  Result Date: 03/22/2020 CLINICAL DATA:  Abnormal uterine bleeding, abdominal and pelvic cramping, LMP 03/05/2020, negative pregnancy test today EXAM: TRANSABDOMINAL AND TRANSVAGINAL ULTRASOUND OF PELVIS DOPPLER ULTRASOUND OF OVARIES TECHNIQUE: Both transabdominal and transvaginal ultrasound examinations of the pelvis were performed. Transabdominal technique was performed for global imaging of the pelvis  including uterus, ovaries, adnexal regions, and pelvic cul-de-sac. It was necessary to proceed with endovaginal exam following the transabdominal exam to visualize the endometrium. Color and duplex Doppler ultrasound was utilized to evaluate blood flow to the ovaries. COMPARISON:  None FINDINGS: Uterus Measurements: 8.3 x 4.0 x 4.7 cm = volume: 82 mL. Anteverted. Normal morphology without mass Endometrium Thickness: 10 mm.  No endometrial fluid or focal abnormality Right ovary Measurements: 4.0 x 2.8 x 2.7 cm = volume: 15.7 mL. Small resolving corpus luteum. No additional mass. Blood flow present within RIGHT ovary on color Doppler imaging. Left ovary Measurements: 2.9 x 1.8 x 1.5 cm = volume: 4.2  mL. Normal morphology without mass. Blood flow present within LEFT ovary on color Doppler imaging. Pulsed Doppler evaluation of both ovaries demonstrates normal low-resistance arterial and venous waveforms. Other findings No free pelvic fluid.  No adnexal masses. IMPRESSION: Normal exam. Electronically Signed   By: Ulyses Southward M.D.   On: 03/22/2020 17:36    Procedures Procedures (including critical care time)  Medications Ordered in ED Medications  ibuprofen (ADVIL) tablet 800 mg (800 mg Oral Given 03/22/20 1748)    ED Course  I have reviewed the triage vital signs and the nursing notes.  Pertinent labs & imaging results that were available during my care of the patient were reviewed by me and considered in my medical decision making (see chart for details).    MDM Rules/Calculators/A&P                         24 year old lady presents to ER with concern for vaginal bleeding, pelvic cramping.  On exam patient noted be well-appearing with stable vital signs.  Lab work performed earlier today demonstrated drop in hemoglobin from 12->9.  Suspect this is related to her vaginal bleeding.  Transvaginal ultrasound negative for structural cause.  Recommend trial of NSAIDs, close follow-up with her OB/GYN to  discuss any additional treatment options, likely removal of her Nexplanon.  After the discussed management above, the patient was determined to be safe for discharge.  The patient was in agreement with this plan and all questions regarding their care were answered.  ED return precautions were discussed and the patient will return to the ED with any significant worsening of condition.   Final Clinical Impression(s) / ED Diagnoses Final diagnoses:  Vaginal bleeding  Anemia, unspecified type    Rx / DC Orders ED Discharge Orders         Ordered    ibuprofen (ADVIL) 600 MG tablet  3 times daily        03/22/20 1802           Milagros Loll, MD 03/23/20 1530

## 2020-03-22 NOTE — ED Triage Notes (Signed)
Vaginal bleeding x 17 days. Abdominal cramping. Fatigue. She had blood drawn and [facility] and left due to long wait.

## 2020-04-21 ENCOUNTER — Ambulatory Visit: Payer: Self-pay | Admitting: Advanced Practice Midwife

## 2020-05-21 ENCOUNTER — Other Ambulatory Visit: Payer: Self-pay

## 2020-05-21 ENCOUNTER — Encounter: Payer: Self-pay | Admitting: Advanced Practice Midwife

## 2020-05-21 ENCOUNTER — Ambulatory Visit (INDEPENDENT_AMBULATORY_CARE_PROVIDER_SITE_OTHER): Payer: Self-pay | Admitting: Advanced Practice Midwife

## 2020-05-21 VITALS — BP 111/55 | HR 67 | Ht 59.0 in | Wt 140.8 lb

## 2020-05-21 DIAGNOSIS — Z3046 Encounter for surveillance of implantable subdermal contraceptive: Secondary | ICD-10-CM

## 2020-05-21 DIAGNOSIS — Z3049 Encounter for surveillance of other contraceptives: Secondary | ICD-10-CM

## 2020-05-21 DIAGNOSIS — Z30013 Encounter for initial prescription of injectable contraceptive: Secondary | ICD-10-CM

## 2020-05-21 MED ORDER — MEDROXYPROGESTERONE ACETATE 150 MG/ML IM SUSP
150.0000 mg | Freq: Once | INTRAMUSCULAR | Status: AC
  Administered 2020-05-21: 150 mg via INTRAMUSCULAR

## 2020-05-21 NOTE — Patient Instructions (Signed)
Medroxyprogesterone injection [Contraceptive] What is this medicine? MEDROXYPROGESTERONE (me DROX ee proe JES te rone) contraceptive injections prevent pregnancy. They provide effective birth control for 3 months. Depo-subQ Provera 104 is also used for treating pain related to endometriosis. This medicine may be used for other purposes; ask your health care provider or pharmacist if you have questions. COMMON BRAND NAME(S): Depo-Provera, Depo-subQ Provera 104 What should I tell my health care provider before I take this medicine? They need to know if you have any of these conditions:  frequently drink alcohol  asthma  blood vessel disease or a history of a blood clot in the lungs or legs  bone disease such as osteoporosis  breast cancer  diabetes  eating disorder (anorexia nervosa or bulimia)  high blood pressure  HIV infection or AIDS  kidney disease  liver disease  mental depression  migraine  seizures (convulsions)  stroke  tobacco smoker  vaginal bleeding  an unusual or allergic reaction to medroxyprogesterone, other hormones, medicines, foods, dyes, or preservatives  pregnant or trying to get pregnant  breast-feeding How should I use this medicine? Depo-Provera Contraceptive injection is given into a muscle. Depo-subQ Provera 104 injection is given under the skin. These injections are given by a health care professional. You must not be pregnant before getting an injection. The injection is usually given during the first 5 days after the start of a menstrual period or 6 weeks after delivery of a baby. Talk to your pediatrician regarding the use of this medicine in children. Special care may be needed. These injections have been used in female children who have started having menstrual periods. Overdosage: If you think you have taken too much of this medicine contact a poison control center or emergency room at once. NOTE: This medicine is only for you. Do not  share this medicine with others. What if I miss a dose? Try not to miss a dose. You must get an injection once every 3 months to maintain birth control. If you cannot keep an appointment, call and reschedule it. If you wait longer than 13 weeks between Depo-Provera contraceptive injections or longer than 14 weeks between Depo-subQ Provera 104 injections, you could get pregnant. Use another method for birth control if you miss your appointment. You may also need a pregnancy test before receiving another injection. What may interact with this medicine? Do not take this medicine with any of the following medications:  bosentan This medicine may also interact with the following medications:  aminoglutethimide  antibiotics or medicines for infections, especially rifampin, rifabutin, rifapentine, and griseofulvin  aprepitant  barbiturate medicines such as phenobarbital or primidone  bexarotene  carbamazepine  medicines for seizures like ethotoin, felbamate, oxcarbazepine, phenytoin, topiramate  modafinil  St. John's wort This list may not describe all possible interactions. Give your health care provider a list of all the medicines, herbs, non-prescription drugs, or dietary supplements you use. Also tell them if you smoke, drink alcohol, or use illegal drugs. Some items may interact with your medicine. What should I watch for while using this medicine? This drug does not protect you against HIV infection (AIDS) or other sexually transmitted diseases. Use of this product may cause you to lose calcium from your bones. Loss of calcium may cause weak bones (osteoporosis). Only use this product for more than 2 years if other forms of birth control are not right for you. The longer you use this product for birth control the more likely you will be at risk   for weak bones. Ask your health care professional how you can keep strong bones. You may have a change in bleeding pattern or irregular periods.  Many females stop having periods while taking this drug. If you have received your injections on time, your chance of being pregnant is very low. If you think you may be pregnant, see your health care professional as soon as possible. Tell your health care professional if you want to get pregnant within the next year. The effect of this medicine may last a long time after you get your last injection. What side effects may I notice from receiving this medicine? Side effects that you should report to your doctor or health care professional as soon as possible:  allergic reactions like skin rash, itching or hives, swelling of the face, lips, or tongue  breast tenderness or discharge  breathing problems  changes in vision  depression  feeling faint or lightheaded, falls  fever  pain in the abdomen, chest, groin, or leg  problems with balance, talking, walking  unusually weak or tired  yellowing of the eyes or skin Side effects that usually do not require medical attention (report to your doctor or health care professional if they continue or are bothersome):  acne  fluid retention and swelling  headache  irregular periods, spotting, or absent periods  temporary pain, itching, or skin reaction at site where injected  weight gain This list may not describe all possible side effects. Call your doctor for medical advice about side effects. You may report side effects to FDA at 1-800-FDA-1088. Where should I keep my medicine? This does not apply. The injection will be given to you by a health care professional. NOTE: This sheet is a summary. It may not cover all possible information. If you have questions about this medicine, talk to your doctor, pharmacist, or health care provider.  2020 Elsevier/Gold Standard (2008-08-07 18:37:56)  

## 2020-05-21 NOTE — Progress Notes (Signed)
GYNECOLOGY OFFICE PROCEDURE NOTE  Kathryn Silva is a 24 y.o. G2P1011 here for Nexplanon removal.  Patient has not had pap smear due to age.  No other gynecologic concerns. Patient had an episode of heavy bleeding in August related to nexplanon and that is her reason for wanting it removed today. She also has frequent headaches that she thinks may be related to nexplanon.     Nexplanon Removal Patient identified, informed consent performed, consent signed.   Appropriate time out taken. Nexplanon site identified.  Area prepped in usual sterile fashon. One ml of 1% lidocaine was used to anesthetize the area at the distal end of the implant. A small stab incision was made right beside the implant on the distal portion.  The Nexplanon rod was grasped using hemostats and removed without difficulty.  There was minimal blood loss. There were no complications.  3 ml of 1% lidocaine was injected around the incision for post-procedure analgesia.  Steri-strips were applied over the small incision.  A pressure bandage was applied to reduce any bruising.  The patient tolerated the procedure well and was given post procedure instructions.  Patient is planning to use Depo for contraception.  Will have patient return in 3 months for next depo and for annual exam.     Thressa Sheller DNP, CNM  05/21/20  11:23 AM

## 2020-06-08 ENCOUNTER — Encounter: Payer: Self-pay | Admitting: General Practice

## 2020-07-27 ENCOUNTER — Emergency Department (HOSPITAL_BASED_OUTPATIENT_CLINIC_OR_DEPARTMENT_OTHER)
Admission: EM | Admit: 2020-07-27 | Discharge: 2020-07-27 | Disposition: A | Discharge status: Home / Self Care | Payer: Self-pay

## 2020-07-29 ENCOUNTER — Ambulatory Visit (HOSPITAL_COMMUNITY)
Admission: EM | Admit: 2020-07-29 | Discharge: 2020-07-29 | Disposition: A | Discharge status: Home / Self Care | Payer: Self-pay | Attending: Emergency Medicine | Admitting: Emergency Medicine

## 2020-07-29 ENCOUNTER — Other Ambulatory Visit: Payer: Self-pay

## 2020-07-29 ENCOUNTER — Encounter (HOSPITAL_COMMUNITY): Payer: Self-pay

## 2020-07-29 DIAGNOSIS — Z20822 Contact with and (suspected) exposure to covid-19: Secondary | ICD-10-CM

## 2020-07-29 DIAGNOSIS — J029 Acute pharyngitis, unspecified: Secondary | ICD-10-CM

## 2020-07-29 LAB — POC URINE PREG, ED: Preg Test, Ur: NEGATIVE

## 2020-07-29 LAB — RESP PANEL BY RT-PCR (FLU A&B, COVID) ARPGX2
Influenza A by PCR: NEGATIVE
Influenza B by PCR: NEGATIVE
SARS Coronavirus 2 by RT PCR: NEGATIVE

## 2020-07-29 LAB — POCT RAPID STREP A, ED / UC: Streptococcus, Group A Screen (Direct): NEGATIVE

## 2020-07-29 MED ORDER — IBUPROFEN 600 MG PO TABS: 600.0000 mg | ORAL_TABLET | Freq: Four times a day (QID) | ORAL | 0 refills | Status: DC | PRN

## 2020-07-29 MED ORDER — BENZONATATE 200 MG PO CAPS: 200.0000 mg | ORAL_CAPSULE | Freq: Three times a day (TID) | ORAL | 0 refills | Status: DC | PRN

## 2020-07-29 MED ORDER — FLUTICASONE PROPIONATE 50 MCG/ACT NA SUSP: 2.0000 | Freq: Every day | NASAL | 0 refills | Status: DC

## 2020-07-29 NOTE — ED Provider Notes (Signed)
HPI  SUBJECTIVE:  Patient reports sore throat starting 2 days ago. Sx worse with swallowing.  Sx better with nothing.  Has not tried anything for this.  No fever   No neck stiffness  + Cough  no nasal congestion + rhinorrhea, postnasal drip + Myalgias + Headache first day, none since No Rash  No loss of taste or smell No shortness of breath or difficulty breathing No nausea, vomiting No diarrhea No abdominal pain     No Recent Strep, mono, flu exposure.  Has had multiple coworkers with Covid.  Last exposure was 2 days ago.   No Breathing difficulty, voice changes, sensation of throat swelling shut No Drooling No Trismus No abx in past month.  Did not get the Covid or flu vaccine. No antipyretic in past 4-6 hrs  Patient has a past medical history of asthma.  No other medical problems. LMP: She is still bleeding.  She is not sure if she could be pregnant [facility]: [facility]   Past Medical History:  Diagnosis Date  . Asthma   . History of one miscarriage 09/26/2016   08/2016: 11-12wks. No PNC. Came into OB triage VB, pain and BBOW. See surg path for fetus (+abnormalities). Needed D&C for rPOCs. Negative KB, TSH, rpr, hiv, cbc, GC/CT, wet prep, U/a, UDS.     Past Surgical History:  Procedure Laterality Date  . DILATION AND EVACUATION N/A 09/22/2016   Procedure: SUCTION DILATATION AND EVACUATION;  Surgeon: Springbrook Bing, MD;  Location: [facility];  Service: Gynecology;  Laterality: N/A;    Family History  Problem Relation Age of Onset  . Hypertension Mother     Social History   Tobacco Use  . Smoking status: Never Smoker  . Smokeless tobacco: Never Used  Substance Use Topics  . Alcohol use: No  . Drug use: No    No current facility-administered medications for this encounter.  Current Outpatient Medications:  .  benzonatate (TESSALON) 200 MG capsule, Take 1 capsule (200 mg total) by mouth 3 (three) times daily as needed for cough., Disp: 30  capsule, Rfl: 0 .  fluticasone (FLONASE) 50 MCG/ACT nasal spray, Place 2 sprays into both nostrils daily., Disp: 16 g, Rfl: 0 .  ibuprofen (ADVIL) 600 MG tablet, Take 1 tablet (600 mg total) by mouth every 6 (six) hours as needed., Disp: 30 tablet, Rfl: 0 .  albuterol (VENTOLIN HFA) 108 (90 Base) MCG/ACT inhaler, Inhale 1-2 puffs into the lungs every 6 (six) hours as needed for wheezing or shortness of breath., Disp: 18 g, Rfl: 0 .  clotrimazole (LOTRIMIN) 1 % cream, Apply 1 application topically as directed. Apply after each feeding or pumping session., Disp: 30 g, Rfl: 4 .  olopatadine (PATANOL) 0.1 % ophthalmic solution, Place 1 drop into both eyes 2 (two) times daily., Disp: 5 mL, Rfl: 2 .  phenol ([employer] SORE THROAT SPRAY) 1.4 % LIQD, Use as directed 1 spray in the mouth or throat as needed for throat irritation / pain., Disp: 118 mL, Rfl: 0 .  Prenatal Vit-Fe Fumarate-FA (PRENATAL MULTIVITAMIN) TABS tablet, Take 1 tablet by mouth daily at 12 noon., Disp: , Rfl:   No Known Allergies   ROS  As noted in HPI.   Physical Exam  BP 118/63 (BP Location: Left Arm)   Pulse 91   Temp 99 F (37.2 C) (Oral)   Resp 18   SpO2 100%   Breastfeeding No   Constitutional: Well developed, well nourished, no acute distress  Eyes:  EOMI, conjunctiva normal bilaterally HENT: Normocephalic, atraumatic,mucus membranes moist. - nasal congestion + mildly erythematous oropharynx  - enlarged tonsils  - exudates. Uvula midline.  Respiratory: Normal inspiratory effort Cardiovascular: Normal rate, no murmurs, rubs, gallops GI: nondistended, nontender. No appreciable splenomegaly skin: No rash, skin intact Lymph: -  Anterior cervical LN.  No posterior cervical lymphadenopathy Musculoskeletal: no deformities Neurologic: Alert & oriented x 3, no focal neuro deficits Psychiatric: Speech and behavior appropriate.   ED Course   Medications - No data to display  Orders Placed This Encounter  Procedures  .  Resp Panel by RT-PCR (Flu A&B, Covid) Nasopharyngeal Swab    Standing Status:   Standing    Number of Occurrences:   1    Order Specific Question:   Is this test for diagnosis or screening    Answer:   Diagnosis of ill patient    Order Specific Question:   Symptomatic for COVID-19 as defined by CDC    Answer:   Yes    Order Specific Question:   Date of Symptom Onset    Answer:   07/27/2020    Order Specific Question:   Hospitalized for COVID-19    Answer:   No    Order Specific Question:   Admitted to ICU for COVID-19    Answer:   No    Order Specific Question:   Previously tested for COVID-19    Answer:   No    Order Specific Question:   Resident in a congregate (group) care setting    Answer:   No    Order Specific Question:   Employed in healthcare setting    Answer:   No    Order Specific Question:   Pregnant    Answer:   No    Order Specific Question:   Has patient completed COVID vaccination(s) (2 doses of Pfizer/Moderna 1 dose of [employer])    Answer:   Unknown  . Culture, group A strep (throat)    Standing Status:   Standing    Number of Occurrences:   1  . POCT Rapid Strep A    Standing Status:   Standing    Number of Occurrences:   1  . POC urine pregnancy    Standing Status:   Standing    Number of Occurrences:   1    Results for orders placed or performed during the hospital encounter of 07/29/20 (from the past 24 hour(s))  Resp Panel by RT-PCR (Flu A&B, Covid) Nasopharyngeal Swab     Status: None   Collection Time: 07/29/20 10:59 AM   Specimen: Nasopharyngeal Swab; Nasopharyngeal(NP) swabs in vial transport medium  Result Value Ref Range   SARS Coronavirus 2 by RT PCR NEGATIVE NEGATIVE   Influenza A by PCR NEGATIVE NEGATIVE   Influenza B by PCR NEGATIVE NEGATIVE  POCT Rapid Strep A     Status: None   Collection Time: 07/29/20 11:03 AM  Result Value Ref Range   Streptococcus, Group A Screen (Direct) NEGATIVE NEGATIVE  POC urine pregnancy     Status:  None   Collection Time: 07/29/20 11:07 AM  Result Value Ref Range   Preg Test, Ur NEGATIVE NEGATIVE   No results found.  ED Clinical Impression  1. Viral pharyngitis   2. Exposure to COVID-19 virus   3. Encounter for laboratory testing for COVID-19 virus      ED Assessment/Plan   Patient with a viral illness. COVID/flu sent.  May be a candidate for monoclonal body infusion based on history of asthma and unimmunized status.  If flu is positive, will call in Tamiflu.  Urine pregnancy negative.  COVID, flu negative.  Rapid strep negative. Obtaining throat culture to guide antibiotic treatment. Discussed this with patient. We'll contact her if culture is positive, and will call in Appropriate antibiotics. Patient home with ibuprofen, Tylenol, Benadryl/Maalox mixture, Flonase, saline nasal irrigation, Tessalon. Patient to followup with PMD when necessary.  Discussed labs,  MDM, plan and followup with patient. Discussed sn/sx that should prompt return to the ED. patient agrees with plan.   Meds ordered this encounter  Medications  . benzonatate (TESSALON) 200 MG capsule    Sig: Take 1 capsule (200 mg total) by mouth 3 (three) times daily as needed for cough.    Dispense:  30 capsule    Refill:  0  . fluticasone (FLONASE) 50 MCG/ACT nasal spray    Sig: Place 2 sprays into both nostrils daily.    Dispense:  16 g    Refill:  0  . ibuprofen (ADVIL) 600 MG tablet    Sig: Take 1 tablet (600 mg total) by mouth every 6 (six) hours as needed.    Dispense:  30 tablet    Refill:  0     *This clinic note was created using Scientist, clinical (histocompatibility and immunogenetics). Therefore, there may be occasional mistakes despite careful proofreading.     Domenick Gong, MD 07/30/20 [phone]

## 2020-07-29 NOTE — Discharge Instructions (Signed)
Urine pregnancy was negative.  Your Covid and flu tests are pending.  I will contact the monoclonal antibody team if your Covid is positive.  They will contact you in 24 to 48 hours.  If your flu is positive then I will call in a prescription for Tamiflu.  your rapid strep was negative today, so we have sent off a throat culture.  We will contact you and call in the appropriate antibiotics if your culture comes back positive for an infection requiring antibiotic treatment.  Give [place] a working phone number.   1 gram of Tylenol and 600 mg ibuprofen together 3-4 times a day as needed for pain.  Make sure you drink plenty of extra fluids.  Some people find salt water gargles and  Traditional Medicinal's "Throat Coat" tea helpful. Take 5 mL of liquid Benadryl and 5 mL of Maalox. Mix it together, and then hold it in your mouth for as long as you can and then swallow. You may do this 4 times a day.    Below is a list of primary care practices who are taking new patients for you to follow-up with.  [facility] internal medicine clinic Ground Floor - [facility], [address] [place], [place], [place] [address] [phone]  [facility] at [facility] [address] [place], [place] [address] [phone]  [facility] [address] [place], [place] [address] [phone]  [facility] [phone] [address] [place] [address]  [facility]: [address] [place] [address]  [phone]  [facility]: [address] [place] [address]   [phone]  [facility]: [address] [place] [address]  [phone]  [facility] : [address] [place] [address] [phone]  [facility]: [address] [place]  [place] [address] [phone]  [facility]: [address] [place] [address] [phone]  Dr. Oneal Grout [address] [facility] [place] [address]  [phone]  Dr. Jackie Plum, [facility]. [address] [place], [place] [address]  [phone]  Go to www.goodrx.com to look up your medications. This will give you a list of where you can find your prescriptions at the most affordable prices. Or ask the pharmacist what the cash price is, or if they have any other discount programs available to help make your medication more affordable. This can be less expensive than what you would pay with insurance.

## 2020-07-29 NOTE — ED Triage Notes (Signed)
Pt in with c/o cough. ST, and body aches that have been going on since Tuesday  Pt has not had meds for sxs

## 2020-07-31 LAB — CULTURE, GROUP A STREP (THRC)

## 2020-07-31 NOTE — L&D Delivery Note (Signed)
[facility] Delivery Note  Kathryn Silva is a 25 y.o. G3P1011 s/p SVD at [redacted]w[redacted]d. She was admitted for IOL for A1GDM.   ROM: 2h 9m with clear fluid GBS Status: positive > PCN  Delivery Date/Time: 07/01/2021 at 1303 Delivery: Called to room and patient was complete and pushing. Head delivered LOA. No nuchal cord present. Shoulder and body delivered in usual fashion. Infant with spontaneous cry, placed on mother's abdomen, dried and stimulated. Cord clamped x 2 after 1-minute delay, and cut by FOB under my direct supervision. Cord blood drawn. Placenta delivered spontaneously with gentle cord traction. Fundus firm with massage and Pitocin. Labia, perineum, vagina, and cervix were inspected, and the patient was found to have no lacerations requiring repair.   Placenta: intact, 3VC - sent to L&D Complications: none Lacerations: none EBL: 75 cc Analgesia: none   Infant: viable female  APGARs 9, 9  3120 g  Yves Dill, MD PGY1

## 2020-08-06 ENCOUNTER — Ambulatory Visit: Payer: Self-pay

## 2020-08-09 ENCOUNTER — Ambulatory Visit: Payer: Self-pay | Admitting: Obstetrics & Gynecology

## 2020-08-09 ENCOUNTER — Other Ambulatory Visit: Payer: Self-pay

## 2020-10-27 ENCOUNTER — Encounter (HOSPITAL_COMMUNITY): Payer: Self-pay

## 2020-10-27 ENCOUNTER — Other Ambulatory Visit: Payer: Self-pay

## 2020-10-27 ENCOUNTER — Ambulatory Visit (HOSPITAL_COMMUNITY)
Admission: EM | Admit: 2020-10-27 | Discharge: 2020-10-27 | Disposition: A | Discharge status: Home / Self Care | Payer: Self-pay | Attending: Medical Oncology | Admitting: Medical Oncology

## 2020-10-27 DIAGNOSIS — J324 Chronic pansinusitis: Secondary | ICD-10-CM

## 2020-10-27 MED ORDER — AMOXICILLIN-POT CLAVULANATE 875-125 MG PO TABS: 1.0000 | ORAL_TABLET | Freq: Two times a day (BID) | ORAL | 0 refills | Status: DC

## 2020-10-27 NOTE — ED Provider Notes (Signed)
[facility]    CSN: [id number] Arrival date & time: 10/27/20  1751      History   Chief Complaint Chief Complaint  Patient presents with  . Nasal Congestion  . Headache    HPI Christle P Howse is a 25 y.o. female.   HPI  Nasal Congestion: Pt has had nasal congestion and sinus headaches for the past 2 weeks. She has taken zyrtec with minimal relief.  She also has used Flonase without significant relief.  She states that overall symptoms are getting worse especially in the right nostril.. No fevers, sore throat or cough.   Past Medical History:  Diagnosis Date  . Asthma   . History of one miscarriage 09/26/2016   08/2016: 11-12wks. No PNC. Came into OB triage VB, pain and BBOW. See surg path for fetus (+abnormalities). Needed D&C for rPOCs. Negative KB, TSH, rpr, hiv, cbc, GC/CT, wet prep, U/a, UDS.     Patient Active Problem List   Diagnosis Date Noted  . Normal labor 07/21/2017  . Supervision of other normal pregnancy, antepartum 03/19/2017  . History of first trimester miscarriage, currently pregnant 09/26/2016    Past Surgical History:  Procedure Laterality Date  . DILATION AND EVACUATION N/A 09/22/2016   Procedure: SUCTION DILATATION AND EVACUATION;  Surgeon: [place] Bing, MD;  Location: [facility];  Service: Gynecology;  Laterality: N/A;    OB History    Gravida  2   Para  1   Term  1   Preterm      AB  1   Living  1     SAB  1   IAB      Ectopic      Multiple  0   Live Births  1            Home Medications    Prior to Admission medications   Medication Sig Start Date End Date Taking? Authorizing Provider  albuterol (VENTOLIN HFA) 108 (90 Base) MCG/ACT inhaler Inhale 1-2 puffs into the lungs every 6 (six) hours as needed for wheezing or shortness of breath. 10/08/19   Lamptey, Britta Mccreedy, MD  benzonatate (TESSALON) 200 MG capsule Take 1 capsule (200 mg total) by mouth 3 (three) times daily as needed for cough. 07/29/20   Domenick Gong, MD  clotrimazole (LOTRIMIN) 1 % cream Apply 1 application topically as directed. Apply after each feeding or pumping session. 08/27/17   Armando Reichert, CNM  fluticasone (FLONASE) 50 MCG/ACT nasal spray Place 2 sprays into both nostrils daily. 07/29/20   Domenick Gong, MD  ibuprofen (ADVIL) 600 MG tablet Take 1 tablet (600 mg total) by mouth every 6 (six) hours as needed. 07/29/20   Domenick Gong, MD  olopatadine (PATANOL) 0.1 % ophthalmic solution Place 1 drop into both eyes 2 (two) times daily. 10/08/19   Lamptey, Britta Mccreedy, MD  phenol ([employer] SORE THROAT SPRAY) 1.4 % LIQD Use as directed 1 spray in the mouth or throat as needed for throat irritation / pain. 11/02/17   Georgiana Shore, PA-C  Prenatal Vit-Fe Fumarate-FA (PRENATAL MULTIVITAMIN) TABS tablet Take 1 tablet by mouth daily at 12 noon.    [provider]  cetirizine (ZYRTEC ALLERGY) 10 MG tablet Take 1 tablet (10 mg total) by mouth daily. 10/08/19 07/29/20  Merrilee Jansky, MD    Family History Family History  Problem Relation Age of Onset  . Hypertension Mother     Social History Social History   Tobacco Use  .  Smoking status: Never Smoker  . Smokeless tobacco: Never Used  Substance Use Topics  . Alcohol use: No  . Drug use: No     Allergies   Patient has no known allergies.   Review of Systems Review of Systems  As stated above in HPI Physical Exam Triage Vital Signs ED Triage Vitals  Enc Vitals Group     BP 10/27/20 1855 130/75     Pulse Rate 10/27/20 1855 89     Resp 10/27/20 1855 17     Temp 10/27/20 1855 98.9 F (37.2 C)     Temp src --      SpO2 10/27/20 1855 98 %     Weight --      Height --      Head Circumference --      Peak Flow --      Pain Score 10/27/20 1854 5     Pain Loc --      Pain Edu? --      Excl. in GC? --    No data found.  Updated Vital Signs BP 130/75   Pulse 89   Temp 98.9 F (37.2 C)   Resp 17   SpO2 98%   Physical Exam Vitals and nursing  note reviewed.  Constitutional:      General: She is not in acute distress.    Appearance: She is well-developed. She is not ill-appearing, toxic-appearing or diaphoretic.  HENT:     Head: Normocephalic and atraumatic.     Ears:     Comments: Clear middle ear effusion bilaterally     Nose: Congestion present.     Comments: Maxillary and frontal bogginess and mild tenderness to palpation    Mouth/Throat:     Mouth: Mucous membranes are moist.     Pharynx: Oropharynx is clear.  Eyes:     Extraocular Movements: Extraocular movements intact.     Pupils: Pupils are equal, round, and reactive to light.  Cardiovascular:     Rate and Rhythm: Normal rate and regular rhythm.     Heart sounds: Normal heart sounds.  Pulmonary:     Effort: Pulmonary effort is normal.     Breath sounds: Normal breath sounds.  Musculoskeletal:     Cervical back: Normal range of motion and neck supple.  Lymphadenopathy:     Cervical: No cervical adenopathy.  Neurological:     Mental Status: She is alert.     Motor: No weakness.     Coordination: Coordination normal.  Psychiatric:        Behavior: Behavior normal.      UC Treatments / Results  Labs (all labs ordered are listed, but only abnormal results are displayed) Labs Reviewed - No data to display  EKG   Radiology No results found.  Procedures Procedures (including critical care time)  Medications Ordered in UC Medications - No data to display  Initial Impression / Assessment and Plan / UC Course  I have reviewed the triage vital signs and the nursing notes.  Pertinent labs & imaging results that were available during my care of the patient were reviewed by me and considered in my medical decision making (see chart for details).     New.  Treating with Augmentin.  Discussed how to use along with common potential side effects and precautions.  I want her to continue her Zyrtec and Flonase. Final Clinical Impressions(s) / UC Diagnoses    Final diagnoses:  None   Discharge Instructions  None    ED Prescriptions    None     PDMP not reviewed this encounter.   Rushie Chestnut, [place] 10/27/20 1922

## 2020-10-27 NOTE — ED Triage Notes (Signed)
Pt in with c/o congestion and headaches that have been going on for a few weeks now  States she has been taking zyrtec with minimal relief

## 2020-11-30 ENCOUNTER — Emergency Department (HOSPITAL_BASED_OUTPATIENT_CLINIC_OR_DEPARTMENT_OTHER)
Admission: EM | Admit: 2020-11-30 | Discharge: 2020-11-30 | Disposition: A | Discharge status: Home / Self Care | Payer: Self-pay | Attending: Emergency Medicine | Admitting: Emergency Medicine

## 2020-11-30 ENCOUNTER — Other Ambulatory Visit: Payer: Self-pay

## 2020-11-30 ENCOUNTER — Encounter (HOSPITAL_BASED_OUTPATIENT_CLINIC_OR_DEPARTMENT_OTHER): Payer: Self-pay

## 2020-11-30 DIAGNOSIS — Z3491 Encounter for supervision of normal pregnancy, unspecified, first trimester: Secondary | ICD-10-CM

## 2020-11-30 DIAGNOSIS — Z20822 Contact with and (suspected) exposure to covid-19: Secondary | ICD-10-CM | POA: Insufficient documentation

## 2020-11-30 DIAGNOSIS — B349 Viral infection, unspecified: Secondary | ICD-10-CM | POA: Diagnosis not present

## 2020-11-30 DIAGNOSIS — O98511 Other viral diseases complicating pregnancy, first trimester: Secondary | ICD-10-CM | POA: Insufficient documentation

## 2020-11-30 DIAGNOSIS — O99511 Diseases of the respiratory system complicating pregnancy, first trimester: Secondary | ICD-10-CM | POA: Insufficient documentation

## 2020-11-30 DIAGNOSIS — Z7951 Long term (current) use of inhaled steroids: Secondary | ICD-10-CM | POA: Diagnosis not present

## 2020-11-30 DIAGNOSIS — Z28310 Unvaccinated for covid-19: Secondary | ICD-10-CM | POA: Diagnosis not present

## 2020-11-30 DIAGNOSIS — R000 Tachycardia, unspecified: Secondary | ICD-10-CM | POA: Diagnosis not present

## 2020-11-30 DIAGNOSIS — Z3A08 8 weeks gestation of pregnancy: Secondary | ICD-10-CM | POA: Insufficient documentation

## 2020-11-30 DIAGNOSIS — J45909 Unspecified asthma, uncomplicated: Secondary | ICD-10-CM | POA: Insufficient documentation

## 2020-11-30 LAB — URINALYSIS, ROUTINE W REFLEX MICROSCOPIC
Bilirubin Urine: NEGATIVE
Glucose, UA: NEGATIVE mg/dL
Hgb urine dipstick: NEGATIVE
Ketones, ur: NEGATIVE mg/dL
Leukocytes,Ua: NEGATIVE
Nitrite: NEGATIVE
Protein, ur: NEGATIVE mg/dL
Specific Gravity, Urine: 1.015 (ref 1.005–1.030)
pH: 6 (ref 5.0–8.0)

## 2020-11-30 LAB — RESP PANEL BY RT-PCR (FLU A&B, COVID) ARPGX2
Influenza A by PCR: NEGATIVE
Influenza B by PCR: NEGATIVE
SARS Coronavirus 2 by RT PCR: NEGATIVE

## 2020-11-30 LAB — PREGNANCY, URINE: Preg Test, Ur: POSITIVE — AB

## 2020-11-30 MED ORDER — KETOROLAC TROMETHAMINE 15 MG/ML IJ SOLN
30.0000 mg | Freq: Once | INTRAMUSCULAR | Status: AC
  Administered 2020-11-30: 30 mg via INTRAMUSCULAR
  Filled 2020-11-30: qty 2

## 2020-11-30 MED ORDER — ONDANSETRON 4 MG PO TBDP
4.0000 mg | ORAL_TABLET | Freq: Once | ORAL | Status: AC
  Administered 2020-11-30: 4 mg via ORAL
  Filled 2020-11-30: qty 1

## 2020-11-30 MED ORDER — PRENATAL COMPLETE 14-0.4 MG PO TABS: 1.0000 | ORAL_TABLET | Freq: Every day | ORAL | 0 refills | Status: DC

## 2020-11-30 NOTE — ED Triage Notes (Signed)
Pt reports headache, sore throat, abd pain, N/V that started yesterday afternoon. Took tylenol yesterday afternoon with no relief.

## 2020-11-30 NOTE — ED Provider Notes (Signed)
[facility] EMERGENCY DEPARTMENT Provider Note   CSN: [id number] Arrival date & time: 11/30/20  5009     History Chief Complaint  Patient presents with  . Headache    Kathryn Silva is a 25 y.o. female.  HPI     This is a 25 year old female with a history of asthma who presents with headache, nausea, vomiting, congestion, sore throat.  Onset of symptoms were yesterday.  Patient states that she has a sore throat and developed a headache.  She describes the headache as throbbing and a 5 out of 10.  She took Tylenol yesterday with no relief.  No neck pain or stiffness.  She reports some nasal congestion.  She has had some nausea and earlier this morning had an episode of nonbilious, nonbloody emesis "out of the blue".  She denies chest pain, shortness of breath, abdominal pain.  No change in bowels.  No known sick contacts or COVID exposures.  She is not vaccinated against COVID-19.  Past Medical History:  Diagnosis Date  . Asthma   . History of one miscarriage 09/26/2016   08/2016: 11-12wks. No PNC. Came into OB triage VB, pain and BBOW. See surg path for fetus (+abnormalities). Needed D&C for rPOCs. Negative KB, TSH, rpr, hiv, cbc, GC/CT, wet prep, U/a, UDS.     Patient Active Problem List   Diagnosis Date Noted  . Normal labor 07/21/2017  . Supervision of other normal pregnancy, antepartum 03/19/2017  . History of first trimester miscarriage, currently pregnant 09/26/2016    Past Surgical History:  Procedure Laterality Date  . DILATION AND EVACUATION N/A 09/22/2016   Procedure: SUCTION DILATATION AND EVACUATION;  Surgeon: [place] Bing, MD;  Location: [facility];  Service: Gynecology;  Laterality: N/A;     OB History    Gravida  2   Para  1   Term  1   Preterm      AB  1   Living  1     SAB  1   IAB      Ectopic      Multiple  0   Live Births  1           Family History  Problem Relation Age of Onset  . Hypertension Mother     Social  History   Tobacco Use  . Smoking status: Never Smoker  . Smokeless tobacco: Never Used  Substance Use Topics  . Alcohol use: No  . Drug use: No    Home Medications Prior to Admission medications   Medication Sig Start Date End Date Taking? Authorizing Provider  Prenatal Vit-Fe Fumarate-FA (PRENATAL COMPLETE) 14-0.4 MG TABS Take 1 tablet by mouth daily. 11/30/20  Yes Jo-Ann Johanning, Mayer Masker, MD  albuterol (VENTOLIN HFA) 108 (90 Base) MCG/ACT inhaler Inhale 1-2 puffs into the lungs every 6 (six) hours as needed for wheezing or shortness of breath. 10/08/19   Merrilee Jansky, MD  amoxicillin-clavulanate (AUGMENTIN) 875-125 MG tablet Take 1 tablet by mouth every 12 (twelve) hours. 10/27/20   Rushie Chestnut, PA-C  benzonatate (TESSALON) 200 MG capsule Take 1 capsule (200 mg total) by mouth 3 (three) times daily as needed for cough. 07/29/20   Domenick Gong, MD  clotrimazole (LOTRIMIN) 1 % cream Apply 1 application topically as directed. Apply after each feeding or pumping session. 08/27/17   Armando Reichert, CNM  fluticasone (FLONASE) 50 MCG/ACT nasal spray Place 2 sprays into both nostrils daily. 07/29/20   Domenick Gong, MD  ibuprofen (ADVIL) 600 MG tablet Take 1 tablet (600 mg total) by mouth every 6 (six) hours as needed. 07/29/20   Domenick Gong, MD  olopatadine (PATANOL) 0.1 % ophthalmic solution Place 1 drop into both eyes 2 (two) times daily. 10/08/19   Lamptey, Britta Mccreedy, MD  phenol ([employer] SORE THROAT SPRAY) 1.4 % LIQD Use as directed 1 spray in the mouth or throat as needed for throat irritation / pain. 11/02/17   Georgiana Shore, PA-C  Prenatal Vit-Fe Fumarate-FA (PRENATAL MULTIVITAMIN) TABS tablet Take 1 tablet by mouth daily at 12 noon.    [provider]  cetirizine (ZYRTEC ALLERGY) 10 MG tablet Take 1 tablet (10 mg total) by mouth daily. 10/08/19 07/29/20  Merrilee Jansky, MD    Allergies    Patient has no known allergies.  Review of Systems   Review of  Systems  Constitutional: Negative for fever.  HENT: Positive for congestion and sore throat.   Respiratory: Negative for cough and shortness of breath.   Cardiovascular: Negative for chest pain.  Gastrointestinal: Positive for diarrhea and vomiting. Negative for abdominal pain.  Neurological: Positive for headaches.  All other systems reviewed and are negative.   Physical Exam Updated Vital Signs BP 124/82   Pulse (!) 101   Temp 98.5 F (36.9 C) (Oral)   Resp 16   Ht 1.499 m (4\' 11" )   Wt 59 kg   LMP  (LMP Unknown)   SpO2 99%   BMI 26.26 kg/m   Physical Exam Vitals and nursing note reviewed.  Constitutional:      Appearance: She is well-developed. She is not ill-appearing.  HENT:     Head: Normocephalic and atraumatic.     Mouth/Throat:     Mouth: Mucous membranes are moist.     Comments: Uvula midline, no tonsillar exudate or swelling, tenderness palpation over the bilateral maxillary sinuses, nasal congestion noted Eyes:     Pupils: Pupils are equal, round, and reactive to light.  Neck:     Comments: No meningismus Cardiovascular:     Rate and Rhythm: Normal rate and regular rhythm.     Heart sounds: Normal heart sounds.  Pulmonary:     Effort: Pulmonary effort is normal. No respiratory distress.     Breath sounds: No wheezing.  Abdominal:     General: Bowel sounds are normal.     Palpations: Abdomen is soft.     Tenderness: There is no abdominal tenderness. There is no guarding.  Musculoskeletal:     Cervical back: Neck supple.  Lymphadenopathy:     Cervical: Cervical adenopathy present.  Skin:    General: Skin is warm and dry.  Neurological:     Mental Status: She is alert and oriented to person, place, and time.  Psychiatric:        Mood and Affect: Mood normal.     ED Results / Procedures / Treatments   Labs (all labs ordered are listed, but only abnormal results are displayed) Labs Reviewed  PREGNANCY, URINE - Abnormal; Notable for the following  components:      Result Value   Preg Test, Ur POSITIVE (*)    All other components within normal limits  RESP PANEL BY RT-PCR (FLU A&B, COVID) ARPGX2  URINALYSIS, ROUTINE W REFLEX MICROSCOPIC    EKG None  Radiology No results found.  Procedures Ultrasound ED OB Pelvic  Date/Time: 11/30/2020 6:00 AM Performed by: 01/30/2021, MD Authorized by: Shon Baton, MD  Procedure details:    Indications: evaluate for IUP     Assess:  EGA and intrauterine pregnancy   Technique:  Transabdominal obstetric (HCG+) exam   Images: not archived    Uterine findings:    Intrauterine pregnancy: identified     Single gestation: identified     Yolk sac: identified     Fetal heart rate: identified (178)     Estimated gestational age: 76 weeks Left ovary findings:    Left ovary:  Not visualized    Right ovary findings:     Right ovary:  Not visualized    Other findings:    Free pelvic fluid: not identified     Free peritoneal fluid: not identified       Medications Ordered in ED Medications  ketorolac (TORADOL) 15 MG/ML injection 30 mg (30 mg Intramuscular Given 11/30/20 0519)  ondansetron (ZOFRAN-ODT) disintegrating tablet 4 mg (4 mg Oral Given 11/30/20 [id number])    ED Course  I have reviewed the triage vital signs and the nursing notes.  Pertinent labs & imaging results that were available during my care of the patient were reviewed by me and considered in my medical decision making (see chart for details).  Clinical Course as of 11/30/20 0600  Tue Nov 30, 2020  [id number] Patient noted to have a positive pregnancy test.  Reports irregular periods at baseline.  Last period was in February.  She is not having any abdominal pain or vaginal bleeding.  She reports significant improvement of her symptoms with Zofran and Toradol.  I advised her that normally I would not give Toradol to a pregnant patient.  However, given that she is likely in her first trimester, 1 dose should not have any  long-term effects.  She was instructed to avoid NSAIDs in the future.  Bedside ultrasound documented above which confirms an intrauterine pregnancy.  Highly suspect patient's other symptoms are related to viral etiology.  Recommend start a prenatal vitamin. [CH]    Clinical Course User Index [CH] Soundra Lampley, Mayer Maskerourtney F, MD   MDM Rules/Calculators/A&P                          Patient presents with upper respiratory symptoms including congestion, sore throat, headache and emesis.  She is overall nontoxic and vital signs are notable for slight tachycardia at 101.  Her physical exam is fairly benign.  No meningismus to suggest meningitis.  She is neurologically intact.  Oropharynx without obvious infection.  I highly suspect viral etiology.  We discussed supportive treatment.  Urinalysis was obtained to evaluate for dehydration given vomiting as well as pregnancy.  Patient reports irregular periods at baseline.  See clinical course above.  Urine pregnancy was noted to be positive.  She does not appear to be having any pregnancy complications and ultrasound shows an intrauterine pregnancy with a crown-rump length consistent with approximately [redacted] weeks gestation.  Heart rate 178.  Patient reassured.  Discussed with her supportive measures at home.  She will check MyChart for COVID and influenza testing.  She was instructed to start a prenatal vitamin and follow-up with OB/GYN for definitive dating given irregular periods.  After history, exam, and medical workup I feel the patient has been appropriately medically screened and is safe for discharge home. Pertinent diagnoses were discussed with the patient. Patient was given return precautions.  Final Clinical Impression(s) / ED Diagnoses Final diagnoses:  Viral syndrome  First trimester pregnancy    Rx /  DC Orders ED Discharge Orders         Ordered    Prenatal Vit-Fe Fumarate-FA (PRENATAL COMPLETE) 14-0.4 MG TABS  Daily        11/30/20 0557            Shon Baton, MD 11/30/20 [phone]

## 2020-11-30 NOTE — Discharge Instructions (Addendum)
You were seen today for upper respiratory symptoms.  This is likely related to a virus.  Your COVID and flu testing are pending.  Check MyChart for results.  You were noted to be pregnant.  Your ultrasound is consistent with a first trimester pregnancy.  You should start a prenatal vitamin and follow-up closely with your OB/GYN for definitive dating.  If you develop vaginal bleeding or abdominal pain, you should be reevaluated.  Of note, if your COVID test is negative, you should consider COVID-19 vaccination given high risk for severe illness with pregnancy.

## 2020-12-15 ENCOUNTER — Encounter: Payer: Self-pay | Admitting: Certified Nurse Midwife

## 2020-12-19 ENCOUNTER — Inpatient Hospital Stay (HOSPITAL_COMMUNITY)
Admission: AD | Admit: 2020-12-19 | Discharge: 2020-12-19 | Disposition: A | Discharge status: Home / Self Care | Payer: Medicaid Other | Attending: Obstetrics & Gynecology | Admitting: Obstetrics & Gynecology

## 2020-12-19 ENCOUNTER — Other Ambulatory Visit: Payer: Self-pay

## 2020-12-19 ENCOUNTER — Encounter (HOSPITAL_COMMUNITY): Payer: Self-pay | Admitting: Obstetrics & Gynecology

## 2020-12-19 DIAGNOSIS — R109 Unspecified abdominal pain: Secondary | ICD-10-CM | POA: Diagnosis not present

## 2020-12-19 DIAGNOSIS — O26892 Other specified pregnancy related conditions, second trimester: Secondary | ICD-10-CM | POA: Insufficient documentation

## 2020-12-19 DIAGNOSIS — Z3A11 11 weeks gestation of pregnancy: Secondary | ICD-10-CM

## 2020-12-19 DIAGNOSIS — Z3A14 14 weeks gestation of pregnancy: Secondary | ICD-10-CM | POA: Diagnosis not present

## 2020-12-19 DIAGNOSIS — O26891 Other specified pregnancy related conditions, first trimester: Secondary | ICD-10-CM

## 2020-12-19 DIAGNOSIS — Z3689 Encounter for other specified antenatal screening: Secondary | ICD-10-CM

## 2020-12-19 DIAGNOSIS — O368390 Maternal care for abnormalities of the fetal heart rate or rhythm, unspecified trimester, not applicable or unspecified: Secondary | ICD-10-CM

## 2020-12-19 DIAGNOSIS — O26899 Other specified pregnancy related conditions, unspecified trimester: Secondary | ICD-10-CM

## 2020-12-19 LAB — URINALYSIS, ROUTINE W REFLEX MICROSCOPIC
Bilirubin Urine: NEGATIVE
Glucose, UA: NEGATIVE mg/dL
Hgb urine dipstick: NEGATIVE
Ketones, ur: NEGATIVE mg/dL
Nitrite: NEGATIVE
Protein, ur: NEGATIVE mg/dL
Specific Gravity, Urine: 1.01 (ref 1.005–1.030)
pH: 7 (ref 5.0–8.0)

## 2020-12-19 NOTE — Discharge Instructions (Signed)
Return to care  If you have heavier bleeding that soaks through more that 2 pads per hour for an hour or more If you bleed so much that you feel like you might pass out or you do pass out If you have significant abdominal pain that is not improved with Tylenol     Safe Medications in Pregnancy   Acne: Benzoyl Peroxide Salicylic Acid  Backache/Headache: Tylenol: 2 regular strength every 4 hours OR              2 Extra strength every 6 hours  Colds/Coughs/Allergies: Benadryl (alcohol free) 25 mg every 6 hours as needed Breath right strips Claritin Cepacol throat lozenges Chloraseptic throat spray Cold-Eeze- up to three times per day Cough drops, alcohol free Flonase (by prescription only) Guaifenesin Mucinex Robitussin DM (plain only, alcohol free) Saline nasal spray/drops Sudafed (pseudoephedrine) & Actifed ** use only after [redacted] weeks gestation and if you do not have high blood pressure Tylenol Vicks Vaporub Zinc lozenges Zyrtec   Constipation: Colace Ducolax suppositories Fleet enema Glycerin suppositories Metamucil Milk of magnesia Miralax Senokot Smooth move tea  Diarrhea: Kaopectate Imodium A-D  *NO pepto Bismol  Hemorrhoids: Anusol Anusol HC Preparation H Tucks  Indigestion: Tums Maalox Mylanta Zantac  Pepcid  Insomnia: Benadryl (alcohol free) 25mg every 6 hours as needed Tylenol PM Unisom, no Gelcaps  Leg Cramps: Tums MagGel  Nausea/Vomiting:  Bonine Dramamine Emetrol Ginger extract Sea bands Meclizine  Nausea medication to take during pregnancy:  Unisom (doxylamine succinate 25 mg tablets) Take one tablet daily at bedtime. If symptoms are not adequately controlled, the dose can be increased to a maximum recommended dose of two tablets daily (1/2 tablet in the morning, 1/2 tablet mid-afternoon and one at bedtime). Vitamin B6 100mg tablets. Take one tablet twice a day (up to 200 mg per day).  Skin Rashes: Aveeno  products Benadryl cream or 25mg every 6 hours as needed Calamine Lotion 1% cortisone cream  Yeast infection: Gyne-lotrimin 7 Monistat 7  Gum/tooth pain: Anbesol  **If taking multiple medications, please check labels to avoid duplicating the same active ingredients **take medication as directed on the label ** Do not exceed 4000 mg of tylenol in 24 hours **Do not take medications that contain aspirin or ibuprofen    

## 2020-12-19 NOTE — MAU Provider Note (Signed)
History     CSN: [id number]  Arrival date and time: 12/19/20 2111   Event Date/Time   First Provider Initiated Contact with Patient 12/19/20 2147      Chief Complaint  Patient presents with  . Abdominal Pain   HPI Kathryn Silva is a 25 y.o. G3P1011 at [redacted]w[redacted]d by very unsure LMP who presents with abdominal cramping. Reports episode of cramping after she vomited earlier today. Nausea & vomiting has not continued. Abdominal cramping has not continued. Denies fever, diarrhea, dysuria, vaginal discharge, or vaginal bleeding. Has new ob appointment on 5/31.    OB History    Gravida  3   Para  1   Term  1   Preterm      AB  1   Living  1     SAB  1   IAB      Ectopic      Multiple  0   Live Births  1           Past Medical History:  Diagnosis Date  . Asthma   . History of one miscarriage 09/26/2016   08/2016: 11-12wks. No PNC. Came into OB triage VB, pain and BBOW. See surg path for fetus (+abnormalities). Needed D&C for rPOCs. Negative KB, TSH, rpr, hiv, cbc, GC/CT, wet prep, U/a, UDS.     Past Surgical History:  Procedure Laterality Date  . DILATION AND EVACUATION N/A 09/22/2016   Procedure: SUCTION DILATATION AND EVACUATION;  Surgeon: Leonidas Bing, MD;  Location: [facility];  Service: Gynecology;  Laterality: N/A;    Family History  Problem Relation Age of Onset  . Hypertension Mother     Social History   Tobacco Use  . Smoking status: Never Smoker  . Smokeless tobacco: Never Used  Substance Use Topics  . Alcohol use: No  . Drug use: No    Allergies: No Known Allergies  Medications Prior to Admission  Medication Sig Dispense Refill Last Dose  . albuterol (VENTOLIN HFA) 108 (90 Base) MCG/ACT inhaler Inhale 1-2 puffs into the lungs every 6 (six) hours as needed for wheezing or shortness of breath. 18 g 0   . amoxicillin-clavulanate (AUGMENTIN) 875-125 MG tablet Take 1 tablet by mouth every 12 (twelve) hours. 14 tablet 0   . benzonatate (TESSALON)  200 MG capsule Take 1 capsule (200 mg total) by mouth 3 (three) times daily as needed for cough. 30 capsule 0   . clotrimazole (LOTRIMIN) 1 % cream Apply 1 application topically as directed. Apply after each feeding or pumping session. 30 g 4   . fluticasone (FLONASE) 50 MCG/ACT nasal spray Place 2 sprays into both nostrils daily. 16 g 0   . ibuprofen (ADVIL) 600 MG tablet Take 1 tablet (600 mg total) by mouth every 6 (six) hours as needed. 30 tablet 0   . olopatadine (PATANOL) 0.1 % ophthalmic solution Place 1 drop into both eyes 2 (two) times daily. 5 mL 2   . phenol ([employer] SORE THROAT SPRAY) 1.4 % LIQD Use as directed 1 spray in the mouth or throat as needed for throat irritation / pain. 118 mL 0   . Prenatal Vit-Fe Fumarate-FA (PRENATAL COMPLETE) 14-0.4 MG TABS Take 1 tablet by mouth daily. 60 tablet 0   . Prenatal Vit-Fe Fumarate-FA (PRENATAL MULTIVITAMIN) TABS tablet Take 1 tablet by mouth daily at 12 noon.       Review of Systems  Constitutional: Negative.   Gastrointestinal: Positive for abdominal pain and vomiting.  Genitourinary: Negative.    Physical Exam   Blood pressure 127/66, pulse (!) 101, temperature 99.9 F (37.7 C), temperature source Oral, resp. rate 16, height 4\' 11"  (1.499 m), weight 67.7 kg, last menstrual period 09/06/2020, SpO2 99 %.  Physical Exam Vitals and nursing note reviewed.  Constitutional:      General: She is not in acute distress.    Appearance: She is well-developed and normal weight.  HENT:     Head: Normocephalic and atraumatic.  Eyes:     General: No scleral icterus. Pulmonary:     Effort: Pulmonary effort is normal. No respiratory distress.  Abdominal:     General: Abdomen is flat.     Palpations: Abdomen is soft.     Tenderness: There is no abdominal tenderness.  Skin:    General: Skin is warm and dry.  Neurological:     Mental Status: She is alert.  Psychiatric:        Mood and Affect: Mood normal.        Behavior: Behavior normal.      MAU Course  Procedures Results for orders placed or performed during the hospital encounter of 12/19/20 (from the past 24 hour(s))  Urinalysis, Routine w reflex microscopic Urine, Clean Catch     Status: Abnormal   Collection Time: 12/19/20  9:33 PM  Result Value Ref Range   Color, Urine YELLOW YELLOW   APPearance HAZY (A) CLEAR   Specific Gravity, Urine 1.010 1.005 - 1.030   pH 7.0 5.0 - 8.0   Glucose, UA NEGATIVE NEGATIVE mg/dL   Hgb urine dipstick NEGATIVE NEGATIVE   Bilirubin Urine NEGATIVE NEGATIVE   Ketones, ur NEGATIVE NEGATIVE mg/dL   Protein, ur NEGATIVE NEGATIVE mg/dL   Nitrite NEGATIVE NEGATIVE   Leukocytes,Ua SMALL (A) NEGATIVE   RBC / HPF 0-5 0 - 5 RBC/hpf   WBC, UA 0-5 0 - 5 WBC/hpf   Bacteria, UA RARE (A) NONE SEEN   Squamous Epithelial / LPF 6-10 0 - 5   Mucus PRESENT     MDM RN unsure if cardiac activity heard with doppler since sound wasn't consistent. Patient had ultrasound at previous ED visit that showed an ~ 8 wk IUP (this was about 3 weeks ago). BSUS performed today for viability. Will not adjust EDD by these scans. Current EDD is by very unsure LMP. Will get official dating next week when she starts prenatal care.   Pt informed that the ultrasound is considered a limited OB ultrasound and is not intended to be a complete ultrasound exam.  Patient also informed that the ultrasound is not being completed with the intent of assessing for fetal or placental anomalies or any pelvic abnormalities.  Explained that the purpose of today's ultrasound is to assess for  viability.  Patient acknowledges the purpose of the exam and the limitations of the study.  Active fetus with FHR 169 bpm. CRL [redacted]w[redacted]d.   Patient is currently asymptomatic & reassured by presence of cardiac activity.    Assessment and Plan   1. Abdominal cramping affecting pregnancy   2. [redacted] weeks gestation of pregnancy   3. Unable to hear fetal heart tones as reason for ultrasound scan     -reviewed reasons to return to MAU -keep scheduled ob appointment  [redacted]w[redacted]d 12/19/2020, 10:47 PM

## 2020-12-19 NOTE — MAU Note (Signed)
Kathryn Silva is a 25 y.o. at [redacted]w[redacted]d here in MAU reporting: had some vomiting today and then started having cramping after that. Cramping has been intermittent. No discharge or bleeding. Is not longer feeling nauseated.  Onset of complaint: today  Pain score: 4/10  Vitals:   12/19/20 2136  BP: 134/70  Pulse: (!) 115  Resp: 16  Temp: 99.9 F (37.7 C)  SpO2: 99%    Lab orders placed from triage: UA

## 2020-12-28 ENCOUNTER — Other Ambulatory Visit (HOSPITAL_COMMUNITY)
Admission: RE | Admit: 2020-12-28 | Discharge: 2020-12-28 | Disposition: A | Discharge status: Home / Self Care | Payer: Medicaid Other | Source: Ambulatory Visit | Attending: Certified Nurse Midwife | Admitting: Certified Nurse Midwife

## 2020-12-28 ENCOUNTER — Other Ambulatory Visit: Payer: Self-pay

## 2020-12-28 ENCOUNTER — Ambulatory Visit (INDEPENDENT_AMBULATORY_CARE_PROVIDER_SITE_OTHER): Payer: Self-pay | Admitting: Certified Nurse Midwife

## 2020-12-28 ENCOUNTER — Encounter: Payer: Self-pay | Admitting: Certified Nurse Midwife

## 2020-12-28 VITALS — BP 113/78 | HR 97 | Wt 149.9 lb

## 2020-12-28 DIAGNOSIS — Z3A16 16 weeks gestation of pregnancy: Secondary | ICD-10-CM

## 2020-12-28 DIAGNOSIS — J45909 Unspecified asthma, uncomplicated: Secondary | ICD-10-CM

## 2020-12-28 DIAGNOSIS — Z3492 Encounter for supervision of normal pregnancy, unspecified, second trimester: Secondary | ICD-10-CM

## 2020-12-28 DIAGNOSIS — O0993 Supervision of high risk pregnancy, unspecified, third trimester: Secondary | ICD-10-CM | POA: Insufficient documentation

## 2020-12-28 DIAGNOSIS — Z3009 Encounter for other general counseling and advice on contraception: Secondary | ICD-10-CM

## 2020-12-28 MED ORDER — BLOOD PRESSURE KIT DEVI: 1.0000 | Freq: Once | 0 refills | Status: AC

## 2020-12-28 MED ORDER — BLOOD PRESSURE KIT DEVI: 1.0000 | Freq: Once | 0 refills | Status: DC

## 2020-12-28 NOTE — Progress Notes (Signed)
History:   Kathryn Silva is a 25 y.o. G3P1011 at [redacted]w[redacted]d by very approximate LMP being seen today for her first obstetrical visit.  Her obstetrical history is not significant. Patient does intend to breast feed. Pregnancy history fully reviewed.  Patient reports nausea and sometimes lightheadedness, also overall lack of appetite. Only vomiting occasionally, not daily.   HISTORY: OB History  Gravida Para Term Preterm AB Living  3 1 1  0 1 1  SAB IAB Ectopic Multiple Live Births  1 0 0 0 1    # Outcome Date GA Lbr Len/2nd Weight Sex Delivery Anes PTL Lv  3 Current           2 Term 07/22/17 [redacted]w[redacted]d 06:00 / 00:03 7 lb 4.2 oz (3.294 kg) M Vag-Spont None  LIV     Birth Comments: wnl     Name: Kathryn Silva     Apgar1: 9  Apgar5: 9  1 SAB 09/22/16 [redacted]w[redacted]d   M SAB        Birth Comments: D&C done    Last pap smear was done "years ago" and was normal per patient  Past Medical History:  Diagnosis Date  . Asthma   . History of one miscarriage 09/26/2016   08/2016: 11-12wks. No PNC. Came into OB triage VB, pain and BBOW. See surg path for fetus (+abnormalities). Needed D&C for rPOCs. Negative KB, TSH, rpr, hiv, cbc, GC/CT, wet prep, U/a, UDS.   . Supervision of other normal pregnancy, antepartum 03/19/2017    Clinic [facility] Prenatal Labs Dating  2nd trimester 03/21/2017 Blood type: O/Positive/-- (08/20 1354)  Genetic Screen 1 Screen: too late      Quad: declines     Antibody:Negative (08/20 1354) negative Anatomic 07-05-2002  Normal - female Rubella: 6.07 (08/20 1354)immune GTT Early:  N/A             Third trimester:  RPR: Non Reactive (08/20 1354)  Flu vaccine  Declined HBsAg: Negative (08/20 1354) negative TDaP vacc   Past Surgical History:  Procedure Laterality Date  . DILATION AND EVACUATION N/A 09/22/2016   Procedure: SUCTION DILATATION AND EVACUATION;  Surgeon: 09/24/2016, MD;  Location: [facility];  Service: Gynecology;  Laterality: N/A;   Family History  Problem Relation Age of Onset  . Hypertension  Mother    Social History   Tobacco Use  . Smoking status: Never Smoker  . Smokeless tobacco: Never Used  Vaping Use  . Vaping Use: Never used  Substance Use Topics  . Alcohol use: No  . Drug use: No   No Known Allergies Current Outpatient Medications on File Prior to Visit  Medication Sig Dispense Refill  . Prenatal Vit-Fe Fumarate-FA (PRENATAL COMPLETE) 14-0.4 MG TABS Take 1 tablet by mouth daily. 60 tablet 0  . albuterol (VENTOLIN HFA) 108 (90 Base) MCG/ACT inhaler Inhale 1-2 puffs into the lungs every 6 (six) hours as needed for wheezing or shortness of breath. (Patient not taking: Reported on 12/28/2020) 18 g 0  . fluticasone (FLONASE) 50 MCG/ACT nasal spray Place 2 sprays into both nostrils daily. (Patient not taking: Reported on 12/28/2020) 16 g 0  . [DISCONTINUED] cetirizine (ZYRTEC ALLERGY) 10 MG tablet Take 1 tablet (10 mg total) by mouth daily. 30 tablet 2   No current facility-administered medications on file prior to visit.   Review of Systems Pertinent items noted in HPI and remainder of comprehensive ROS otherwise negative. Physical Exam:   Vitals:   12/28/20 0846  BP: 113/78  Pulse: 97  Weight: 149 lb 14.4 oz (68 kg)   Fetal Heart Rate (bpm): 161  Constitutional: Well-developed, well-nourished pregnant female in no acute distress.  HEENT: PERRLA Skin: normal color and turgor, no rash Cardiovascular: normal rate & rhythm, no murmur Respiratory: normal effort, lung sounds clear throughout GI: Abd soft, non-tender, pos BS x 4, gravid appropriate for gestational age MS: Extremities nontender, no edema, normal ROM Neurologic: Alert and oriented x 4.  GU: no CVA tenderness Pelvic: NEFG, physiologic discharge, no blood, cervix clean. Pap/swabs collected.  Assessment:    Pregnancy: G3P1011 Patient Active Problem List   Diagnosis Date Noted  . Supervision of low-risk pregnancy, second trimester 12/28/2020  . History of first trimester miscarriage, currently  pregnant 09/26/2016     Plan:    1. Supervision of low-risk pregnancy, second trimester Doing well, still having some nausea but managing with small snacks. Still vomits occasionally but not every day. Suggested slowly sipping smoothies in the morning.  2. [redacted] weeks gestation of pregnancy Routine new OB care including: - Initial labs drawn. - Continue prenatal vitamins. - Problem list reviewed and updated. - Genetic Screening discussed, First trimester screen, Quad screen and NIPS: ordered. - Ultrasound discussed; fetal anatomic survey: ordered. - Anticipatory guidance about prenatal visits given including labs, ultrasounds, and testing. - Discussed usage of Babyscripts and virtual visits as additional source of managing and completing prenatal visits in midst of coronavirus and pandemic.   - Encouraged to complete MyChart Registration for her ability to review results, send requests, and have questions addressed.  - The nature of [place] [facility] with multiple MDs and Advanced Practice Providers was explained to patient; also emphasized that residents, students are part of our team. - Routine obstetric precautions reviewed. Encouraged to seek out care at office or emergency room [facility] MAU preferred) for urgent and/or emergent concerns.  3. Uncomplicated asthma, unspecified asthma severity, unspecified whether persistent - No recent exacerbations, stopped taking daily allergy pill when she found out about pregnancy. Safe med list provided. Encouraged to take allergy pill if needed.  4. Birth control counseling, regarding intrauterine device - Discussed various forms of birth control, does not desire pregnancy anytime soon after this one, wants a post-placental IUD.  Return in about 4 weeks (around 01/25/2021) for IN-PERSON, LOB.    Edd Arbour, MSN, CNM, IBCLC Certified Nurse Midwife, [facility]

## 2020-12-28 NOTE — Patient Instructions (Signed)
Safe Medications in Pregnancy   Colds/Coughs/Allergies:  Benadryl (alcohol free) 25 mg every 6 hours as needed  Breath right strips  Claritin  Cepacol throat lozenges  Chloraseptic throat spray  Cold-Eeze- up to three times per day  Cough drops, alcohol free  Flonase (by prescription only)  Guaifenesin  Mucinex  Robitussin DM (plain only, alcohol free)  Saline nasal spray/drops  Sudafed (pseudoephedrine) & Actifed * use only after [redacted] weeks gestation and if you do not have high blood pressure  Tylenol  Vicks Vaporub  Zinc lozenges  Zyrtec

## 2020-12-29 ENCOUNTER — Telehealth: Payer: Self-pay

## 2020-12-29 ENCOUNTER — Encounter: Payer: Self-pay | Admitting: *Deleted

## 2020-12-29 LAB — CBC
Hematocrit: 33.3 % — ABNORMAL LOW (ref 34.0–46.6)
Hemoglobin: 10.4 g/dL — ABNORMAL LOW (ref 11.1–15.9)
MCH: 26.1 pg — ABNORMAL LOW (ref 26.6–33.0)
MCHC: 31.2 g/dL — ABNORMAL LOW (ref 31.5–35.7)
MCV: 84 fL (ref 79–97)
Platelets: 282 10*3/uL (ref 150–450)
RBC: 3.98 x10E6/uL (ref 3.77–5.28)
RDW: 19 % — ABNORMAL HIGH (ref 11.7–15.4)
WBC: 5.2 10*3/uL (ref 3.4–10.8)

## 2020-12-29 LAB — RPR: RPR Ser Ql: NONREACTIVE

## 2020-12-29 LAB — CYTOLOGY - PAP
Chlamydia: NEGATIVE
Comment: NEGATIVE
Comment: NEGATIVE
Comment: NORMAL
Diagnosis: NEGATIVE
Neisseria Gonorrhea: NEGATIVE
Trichomonas: NEGATIVE

## 2020-12-29 LAB — HIV ANTIBODY (ROUTINE TESTING W REFLEX): HIV Screen 4th Generation wRfx: NONREACTIVE

## 2020-12-29 LAB — HEPATITIS B SURFACE ANTIGEN: Hepatitis B Surface Ag: NEGATIVE

## 2020-12-29 LAB — ABO AND RH: Rh Factor: POSITIVE

## 2020-12-29 LAB — HEPATITIS C ANTIBODY: Hep C Virus Ab: <0.1 s/co ratio (ref 0.0–0.9)

## 2020-12-29 LAB — RUBELLA ANTIBODY, IGM: Rubella IgM: <20 AU/mL (ref 0.0–19.9)

## 2020-12-29 NOTE — Telephone Encounter (Signed)
Mailed patient her GFE with a new patient letter and map.  

## 2020-12-30 LAB — AFP, SERUM, OPEN SPINA BIFIDA
AFP MoM: 0.64
AFP Value: 24.9 ng/mL
Gest. Age on Collection Date: 16.1 weeks
Maternal Age At EDD: 24 yr
OSBR Risk 1 IN: 10000
Test Results:: NEGATIVE
Weight: 150 [lb_av]

## 2020-12-30 LAB — URINE CULTURE, OB REFLEX

## 2020-12-30 LAB — CULTURE, OB URINE

## 2021-01-05 ENCOUNTER — Encounter: Payer: Self-pay | Admitting: *Deleted

## 2021-01-11 ENCOUNTER — Telehealth: Payer: Self-pay

## 2021-01-11 DIAGNOSIS — Z148 Genetic carrier of other disease: Secondary | ICD-10-CM | POA: Insufficient documentation

## 2021-01-11 HISTORY — DX: Genetic carrier of other disease: Z14.8

## 2021-01-11 NOTE — Telephone Encounter (Signed)
Patient called and left message on nurse voicemail line stating she is returning our phone call.   Called patient and discussed horizon test results with her. Provided phone number for [employer] to set up genetic information session. Patient verbalized understanding.

## 2021-01-11 NOTE — Telephone Encounter (Signed)
Called Pt to go over Horizon test results showing Pt is a increased carrier risk for SMA, no answer, left VM for call back.

## 2021-01-17 ENCOUNTER — Other Ambulatory Visit: Payer: Self-pay | Admitting: *Deleted

## 2021-01-17 ENCOUNTER — Ambulatory Visit: Payer: Medicaid Other | Attending: Certified Nurse Midwife

## 2021-01-17 ENCOUNTER — Encounter: Payer: Self-pay | Admitting: *Deleted

## 2021-01-17 ENCOUNTER — Other Ambulatory Visit: Payer: Self-pay

## 2021-01-17 ENCOUNTER — Ambulatory Visit: Payer: Medicaid Other | Admitting: *Deleted

## 2021-01-17 VITALS — BP 117/64 | HR 97

## 2021-01-17 DIAGNOSIS — Z3492 Encounter for supervision of normal pregnancy, unspecified, second trimester: Secondary | ICD-10-CM | POA: Insufficient documentation

## 2021-01-17 DIAGNOSIS — Z362 Encounter for other antenatal screening follow-up: Secondary | ICD-10-CM

## 2021-01-17 DIAGNOSIS — J45909 Unspecified asthma, uncomplicated: Secondary | ICD-10-CM | POA: Diagnosis not present

## 2021-01-17 DIAGNOSIS — Z148 Genetic carrier of other disease: Secondary | ICD-10-CM

## 2021-01-24 ENCOUNTER — Ambulatory Visit (INDEPENDENT_AMBULATORY_CARE_PROVIDER_SITE_OTHER): Payer: Medicaid Other | Admitting: Medical

## 2021-01-24 ENCOUNTER — Encounter: Payer: Self-pay | Admitting: Medical

## 2021-01-24 ENCOUNTER — Other Ambulatory Visit: Payer: Self-pay

## 2021-01-24 VITALS — BP 117/68 | HR 93 | Wt 150.1 lb

## 2021-01-24 DIAGNOSIS — Z3492 Encounter for supervision of normal pregnancy, unspecified, second trimester: Secondary | ICD-10-CM

## 2021-01-24 DIAGNOSIS — Z3A17 17 weeks gestation of pregnancy: Secondary | ICD-10-CM

## 2021-01-24 DIAGNOSIS — Z148 Genetic carrier of other disease: Secondary | ICD-10-CM

## 2021-01-24 NOTE — Progress Notes (Signed)
   PRENATAL VISIT NOTE  Subjective:  Kathryn Silva is a 25 y.o. G3P1011 at [redacted]w[redacted]d being seen today for ongoing prenatal care.  She is currently monitored for the following issues for this low-risk pregnancy and has History of first trimester miscarriage, currently pregnant; Supervision of low-risk pregnancy, second trimester; and Carrier of spinal muscular atrophy on their problem list.  Patient reports pelvic pressure.  Contractions: Not present. Vag. Bleeding: None.  Movement: Absent. Denies leaking of fluid.   The following portions of the patient's history were reviewed and updated as appropriate: allergies, current medications, past family history, past medical history, past social history, past surgical history and problem list.   Objective:   Vitals:   01/24/21 1345  BP: 117/68  Pulse: 93  Weight: 150 lb 1.6 oz (68.1 kg)    Fetal Status: Fetal Heart Rate (bpm): 150 Fundal Height: 19 cm Movement: Absent     General:  Alert, oriented and cooperative. Patient is in no acute distress.  Skin: Skin is warm and dry. No rash noted.   Cardiovascular: Normal heart rate noted  Respiratory: Normal respiratory effort, no problems with respiration noted  Abdomen: Soft, gravid, appropriate for gestational age.  Pain/Pressure: Present     Pelvic: Cervical exam deferred        Extremities: Normal range of motion.  Edema: None  Mental Status: Normal mood and affect. Normal behavior. Normal judgment and thought content.   Assessment and Plan:  Pregnancy: G3P1011 at [redacted]w[redacted]d 1. Supervision of low-risk pregnancy, second trimester - Reviewed results from early [place] at 16 weeks and change in due date - Patient scheduled for completion on anatomy US 7/20 - Advised pregnancy belt for increased pelvic pressure, warning signs for worsening condition and reasons to go to MAU discussed  2. Carrier of spinal muscular atrophy - Discussed with patient - Partner testing offered and patient declined   3. [redacted]  weeks gestation of pregnancy  Preterm labor symptoms and general obstetric precautions including but not limited to vaginal bleeding, contractions, leaking of fluid and fetal movement were reviewed in detail with the patient. Please refer to After Visit Summary for other counseling recommendations.   Return in about 4 weeks (around 02/21/2021) for LOB, any provider, In-Person.  Future Appointments  Date Time Provider Department Center  02/16/2021  2:15 PM [facility] NURSE [facility]  02/16/2021  2:30 PM [facility] US[facility]    Vonzella Nipple, PA-C

## 2021-02-16 ENCOUNTER — Ambulatory Visit: Payer: Medicaid Other | Admitting: *Deleted

## 2021-02-16 ENCOUNTER — Encounter: Payer: Self-pay | Admitting: *Deleted

## 2021-02-16 ENCOUNTER — Other Ambulatory Visit: Payer: Self-pay

## 2021-02-16 ENCOUNTER — Ambulatory Visit: Payer: Medicaid Other | Attending: Obstetrics and Gynecology

## 2021-02-16 VITALS — BP 110/61 | HR 93

## 2021-02-16 DIAGNOSIS — Z148 Genetic carrier of other disease: Secondary | ICD-10-CM | POA: Diagnosis present

## 2021-02-16 DIAGNOSIS — E669 Obesity, unspecified: Secondary | ICD-10-CM | POA: Diagnosis not present

## 2021-02-16 DIAGNOSIS — O99212 Obesity complicating pregnancy, second trimester: Secondary | ICD-10-CM | POA: Diagnosis not present

## 2021-02-16 DIAGNOSIS — O99512 Diseases of the respiratory system complicating pregnancy, second trimester: Secondary | ICD-10-CM | POA: Diagnosis not present

## 2021-02-16 DIAGNOSIS — Z362 Encounter for other antenatal screening follow-up: Secondary | ICD-10-CM | POA: Diagnosis not present

## 2021-02-16 DIAGNOSIS — Z3492 Encounter for supervision of normal pregnancy, unspecified, second trimester: Secondary | ICD-10-CM

## 2021-02-16 DIAGNOSIS — J45909 Unspecified asthma, uncomplicated: Secondary | ICD-10-CM

## 2021-02-16 DIAGNOSIS — Z3A20 20 weeks gestation of pregnancy: Secondary | ICD-10-CM

## 2021-02-17 ENCOUNTER — Other Ambulatory Visit: Payer: Self-pay | Admitting: *Deleted

## 2021-02-17 DIAGNOSIS — Z362 Encounter for other antenatal screening follow-up: Secondary | ICD-10-CM

## 2021-02-24 ENCOUNTER — Encounter: Payer: Self-pay | Admitting: Family Medicine

## 2021-02-24 ENCOUNTER — Ambulatory Visit (INDEPENDENT_AMBULATORY_CARE_PROVIDER_SITE_OTHER): Payer: Medicaid Other | Admitting: Obstetrics and Gynecology

## 2021-02-24 ENCOUNTER — Encounter: Payer: Self-pay | Admitting: Obstetrics and Gynecology

## 2021-02-24 ENCOUNTER — Other Ambulatory Visit: Payer: Self-pay

## 2021-02-24 VITALS — BP 129/66 | HR 113 | Wt 148.5 lb

## 2021-02-24 DIAGNOSIS — Z148 Genetic carrier of other disease: Secondary | ICD-10-CM

## 2021-02-24 DIAGNOSIS — Z3492 Encounter for supervision of normal pregnancy, unspecified, second trimester: Secondary | ICD-10-CM

## 2021-02-24 NOTE — Progress Notes (Signed)
   PRENATAL VISIT NOTE  Subjective:  Kathryn Silva is a 25 y.o. G3P1011 at [redacted]w[redacted]d being seen today for ongoing prenatal care.  She is currently monitored for the following issues for this low-risk pregnancy and has History of first trimester miscarriage, currently pregnant; Supervision of low-risk pregnancy, second trimester; and Carrier of spinal muscular atrophy on their problem list.  Patient reports no complaints.  Contractions: Not present. Vag. Bleeding: None.  Movement: Present. Denies leaking of fluid.   The following portions of the patient's history were reviewed and updated as appropriate: allergies, current medications, past family history, past medical history, past social history, past surgical history and problem list.   Objective:   Vitals:   02/24/21 1423  BP: 129/66  Pulse: (!) 113  Weight: 148 lb 8 oz (67.4 kg)    Fetal Status: Fetal Heart Rate (bpm): 155 Fundal Height: 22 cm Movement: Present     General:  Alert, oriented and cooperative. Patient is in no acute distress.  Skin: Skin is warm and dry. No rash noted.   Cardiovascular: Normal heart rate noted  Respiratory: Normal respiratory effort, no problems with respiration noted  Abdomen: Soft, gravid, appropriate for gestational age.  Pain/Pressure: Absent     Pelvic: Cervical exam deferred        Extremities: Normal range of motion.  Edema: Trace  Mental Status: Normal mood and affect. Normal behavior. Normal judgment and thought content.   Assessment and Plan:  Pregnancy: G3P1011 at [redacted]w[redacted]d 1. Supervision of low-risk pregnancy, second trimester Patient is doing well without complaints Work restriction note provided Followup growth ultrasound scheduled  2. Carrier of spinal muscular atrophy Declined genetic testing  Preterm labor symptoms and general obstetric precautions including but not limited to vaginal bleeding, contractions, leaking of fluid and fetal movement were reviewed in detail with the  patient. Please refer to After Visit Summary for other counseling recommendations.   Return in about 4 weeks (around 03/24/2021) for in person, ROB, Low risk.  Future Appointments  Date Time Provider Department Center  04/06/2021 10:45 AM [facility] NURSE [facility]  04/06/2021 11:00 AM [facility]    Catalina Antigua, MD

## 2021-03-25 ENCOUNTER — Other Ambulatory Visit: Payer: Self-pay

## 2021-03-25 ENCOUNTER — Ambulatory Visit (INDEPENDENT_AMBULATORY_CARE_PROVIDER_SITE_OTHER): Payer: Medicaid Other | Admitting: Family Medicine

## 2021-03-25 VITALS — BP 137/73 | HR 123 | Wt 151.1 lb

## 2021-03-25 DIAGNOSIS — Z3009 Encounter for other general counseling and advice on contraception: Secondary | ICD-10-CM

## 2021-03-25 DIAGNOSIS — Z3492 Encounter for supervision of normal pregnancy, unspecified, second trimester: Secondary | ICD-10-CM

## 2021-03-25 DIAGNOSIS — Z3A25 25 weeks gestation of pregnancy: Secondary | ICD-10-CM

## 2021-03-25 NOTE — Progress Notes (Signed)
   Subjective:  Kathryn Silva is a 24 y.o. G3P1011 at [redacted]w[redacted]d being seen today for ongoing prenatal care.  She is currently monitored for the following issues for this low-risk pregnancy and has History of first trimester miscarriage, currently pregnant; Supervision of low-risk pregnancy, second trimester; and Carrier of spinal muscular atrophy on their problem list.  Patient reports no complaints.  Contractions: Not present. Vag. Bleeding: None.  Movement: Present. Denies leaking of fluid.   The following portions of the patient's history were reviewed and updated as appropriate: allergies, current medications, past family history, past medical history, past social history, past surgical history and problem list. Problem list updated.  Objective:   Vitals:   03/25/21 1035  BP: 137/73  Pulse: (!) 123  Weight: 151 lb 1.6 oz (68.5 kg)    Fetal Status: Fetal Heart Rate (bpm): 156 Fundal Height: 25 cm Movement: Present     General:  Alert, oriented and cooperative. Patient is in no acute distress.  Skin: Skin is warm and dry. No rash noted.   Cardiovascular: Normal heart rate noted  Respiratory: Normal respiratory effort, no problems with respiration noted  Abdomen: Soft, gravid, appropriate for gestational age. Pain/Pressure: Absent     Pelvic: Vag. Bleeding: None     Cervical exam deferred        Extremities: Normal range of motion.  Edema: None  Mental Status: Normal mood and affect. Normal behavior. Normal judgment and thought content.   Urinalysis:      Assessment and Plan:  Pregnancy: G3P1011 at [redacted]w[redacted]d  1. Supervision of low-risk pregnancy, second trimester Progressing as expected. No concerns at this time. No labs due today. At next visit due for 28 wk labs and Tdap. Ordered as future order. Offered flu shot today but would like to wait till next visit.  - CBC; Future - HIV Antibody (routine testing w rflx); Future - RPR; Future - Glucose Tolerance, 2 Hours w/1 Hour; Future -  Tdap vaccine greater than or equal to 7yo IM; Future - Flu Vaccine QUAD 67mo+IM (Fluarix, Fluzone & Alfiuria Quad PF); Future  2. Encounter for counseling regarding contraception Would like post placental liletta IUD for contraception  Preterm labor symptoms and general obstetric precautions including but not limited to vaginal bleeding, contractions, leaking of fluid and fetal movement were reviewed in detail with the patient. Please refer to After Visit Summary for other counseling recommendations.  Return in about 3 weeks (around 04/15/2021) for LROB with any provider, early AM lab appt for 28 wk labs.   Warner Mccreedy, MD, MPH OB Fellow, Faculty Practice

## 2021-04-06 ENCOUNTER — Other Ambulatory Visit: Payer: Self-pay

## 2021-04-06 ENCOUNTER — Ambulatory Visit: Payer: Medicaid Other | Admitting: *Deleted

## 2021-04-06 ENCOUNTER — Other Ambulatory Visit: Payer: Self-pay | Admitting: *Deleted

## 2021-04-06 ENCOUNTER — Ambulatory Visit: Payer: Medicaid Other | Attending: Maternal & Fetal Medicine

## 2021-04-06 VITALS — BP 109/53 | HR 99

## 2021-04-06 DIAGNOSIS — Z3492 Encounter for supervision of normal pregnancy, unspecified, second trimester: Secondary | ICD-10-CM

## 2021-04-06 DIAGNOSIS — Z148 Genetic carrier of other disease: Secondary | ICD-10-CM

## 2021-04-06 DIAGNOSIS — E669 Obesity, unspecified: Secondary | ICD-10-CM

## 2021-04-06 DIAGNOSIS — O99212 Obesity complicating pregnancy, second trimester: Secondary | ICD-10-CM | POA: Diagnosis not present

## 2021-04-06 DIAGNOSIS — Z3689 Encounter for other specified antenatal screening: Secondary | ICD-10-CM

## 2021-04-06 DIAGNOSIS — Z362 Encounter for other antenatal screening follow-up: Secondary | ICD-10-CM | POA: Diagnosis not present

## 2021-04-06 DIAGNOSIS — Z3A27 27 weeks gestation of pregnancy: Secondary | ICD-10-CM

## 2021-04-13 ENCOUNTER — Other Ambulatory Visit: Payer: Self-pay

## 2021-04-13 ENCOUNTER — Other Ambulatory Visit: Payer: Medicaid Other

## 2021-04-13 ENCOUNTER — Ambulatory Visit (INDEPENDENT_AMBULATORY_CARE_PROVIDER_SITE_OTHER): Payer: Medicaid Other | Admitting: Certified Nurse Midwife

## 2021-04-13 VITALS — BP 121/65 | HR 89 | Wt 152.7 lb

## 2021-04-13 DIAGNOSIS — Z3492 Encounter for supervision of normal pregnancy, unspecified, second trimester: Secondary | ICD-10-CM

## 2021-04-13 DIAGNOSIS — Z23 Encounter for immunization: Secondary | ICD-10-CM

## 2021-04-13 DIAGNOSIS — O2686 Pruritic urticarial papules and plaques of pregnancy (PUPPP): Secondary | ICD-10-CM

## 2021-04-13 DIAGNOSIS — Z3A28 28 weeks gestation of pregnancy: Secondary | ICD-10-CM

## 2021-04-14 LAB — CBC
Hematocrit: 31 % — ABNORMAL LOW (ref 34.0–46.6)
Hemoglobin: 10.9 g/dL — ABNORMAL LOW (ref 11.1–15.9)
MCH: 31.1 pg (ref 26.6–33.0)
MCHC: 35.2 g/dL (ref 31.5–35.7)
MCV: 88 fL (ref 79–97)
Platelets: 240 10*3/uL (ref 150–450)
RBC: 3.51 x10E6/uL — ABNORMAL LOW (ref 3.77–5.28)
RDW: 13.2 % (ref 11.7–15.4)
WBC: 5.9 10*3/uL (ref 3.4–10.8)

## 2021-04-14 LAB — RPR: RPR Ser Ql: NONREACTIVE

## 2021-04-14 LAB — HIV ANTIBODY (ROUTINE TESTING W REFLEX): HIV Screen 4th Generation wRfx: NONREACTIVE

## 2021-04-14 LAB — GLUCOSE TOLERANCE, 2 HOURS W/ 1HR
Glucose, 1 hour: 120 mg/dL (ref 65–179)
Glucose, 2 hour: 78 mg/dL (ref 65–152)
Glucose, Fasting: 92 mg/dL — ABNORMAL HIGH (ref 65–91)

## 2021-04-14 MED ORDER — HYDROCORTISONE 2.5 % EX CREA: TOPICAL_CREAM | Freq: Two times a day (BID) | CUTANEOUS | 0 refills | Status: DC

## 2021-04-14 NOTE — Progress Notes (Addendum)
   PRENATAL VISIT NOTE  Subjective:  Kathryn Silva is a 25 y.o. G3P1011 at [redacted]w[redacted]d being seen today for ongoing prenatal care.  She is currently monitored for the following issues for this low-risk pregnancy and has History of first trimester miscarriage, currently pregnant; Supervision of low-risk pregnancy, second trimester; and Carrier of spinal muscular atrophy on their problem list.  Patient reports  no physical complaints. Expressed concern because MFM told her she needs another scan for growth, wondered whether baby is ok .  Contractions: Not present. Vag. Bleeding: None.  Movement: Present. Denies leaking of fluid.   The following portions of the patient's history were reviewed and updated as appropriate: allergies, current medications, past family history, past medical history, past social history, past surgical history and problem list.   Objective:   Vitals:   04/13/21 0847  BP: 121/65  Pulse: 89  Weight: 152 lb 11.2 oz (69.3 kg)   Fetal Status: Fetal Heart Rate (bpm): 157 Fundal Height: 28 cm Movement: Present     General:  Alert, oriented and cooperative. Patient is in no acute distress.  Skin: Skin is warm and dry. No rash noted.   Cardiovascular: Normal heart rate noted  Respiratory: Normal respiratory effort, no problems with respiration noted  Abdomen: Soft, gravid, appropriate for gestational age.   Pain/Pressure: Absent     Pelvic: Cervical exam deferred        Extremities: Normal range of motion.  Edema: None  Mental Status: Normal mood and affect. Normal behavior. Normal judgment and thought content.   Assessment and Plan:  Pregnancy: G3P1011 at [redacted]w[redacted]d 1. Supervision of low-risk pregnancy, second trimester - Doing well overall, feeling regular and vigorous fetal movement  2. [redacted] weeks gestation of pregnancy - Routine OB care including 3rd trimester labs - will follow and manage accordingly - Reassurance given that despite baby now being 20th %ile, it is still  appropriate for her gestational age (only measured 1 day off her EDD). Encouraged her to focus on stress management and good nutrition. Pt expressed understanding.  Preterm labor symptoms and general obstetric precautions including but not limited to vaginal bleeding, contractions, leaking of fluid and fetal movement were reviewed in detail with the patient. Please refer to After Visit Summary for other counseling recommendations.   Return in about 2 weeks (around 04/27/2021) for IN-PERSON, LOB.  Future Appointments  Date Time Provider Department Center  05/02/2021  9:15 AM Hermina Staggers, MD [facility]  05/04/2021 10:30 AM [facility] NURSE [facility]  05/04/2021 10:45 AM [facility] US6 [facility]    Bernerd Limbo, CNM

## 2021-04-14 NOTE — Addendum Note (Signed)
Addended by: Edd Arbour on: 04/14/2021 06:12 PM   Modules accepted: Orders

## 2021-04-17 ENCOUNTER — Other Ambulatory Visit: Payer: Self-pay | Admitting: Family Medicine

## 2021-04-17 DIAGNOSIS — Z3492 Encounter for supervision of normal pregnancy, unspecified, second trimester: Secondary | ICD-10-CM

## 2021-04-17 DIAGNOSIS — O24419 Gestational diabetes mellitus in pregnancy, unspecified control: Secondary | ICD-10-CM

## 2021-04-17 DIAGNOSIS — Z8632 Personal history of gestational diabetes: Secondary | ICD-10-CM | POA: Insufficient documentation

## 2021-04-17 MED ORDER — FERROUS SULFATE 325 (65 FE) MG PO TABS: 325.0000 mg | ORAL_TABLET | ORAL | 0 refills | Status: DC

## 2021-04-19 ENCOUNTER — Telehealth: Payer: Self-pay

## 2021-04-19 DIAGNOSIS — O24419 Gestational diabetes mellitus in pregnancy, unspecified control: Secondary | ICD-10-CM

## 2021-04-19 MED ORDER — ACCU-CHEK GUIDE VI STRP: ORAL_STRIP | 6 refills | Status: DC

## 2021-04-19 MED ORDER — ACCU-CHEK GUIDE W/DEVICE KIT: 1.0000 | PACK | Freq: Once | 0 refills | Status: AC

## 2021-04-19 MED ORDER — ACCU-CHEK SOFTCLIX LANCETS MISC: 6 refills | Status: DC

## 2021-04-19 NOTE — Telephone Encounter (Addendum)
-----   Message from Warner Mccreedy, MD sent at 04/17/2021  5:17 AM EDT ----- Regarding: Request to set patient up with nutrition and testing supplies for GDM Hi This patient's 2 hr GTT was abnormal. Could you help her get set up with an appointment with Marylene Land for nutrition counseling and also send her testing supplies?  Thanks Dr. Rondall Allegra pt with results. Explained need for diabetes education appt. Instructed pt to pick up supplies and bring to appt. Referral and supplies ordered.

## 2021-05-02 ENCOUNTER — Other Ambulatory Visit: Payer: Self-pay

## 2021-05-02 ENCOUNTER — Ambulatory Visit (INDEPENDENT_AMBULATORY_CARE_PROVIDER_SITE_OTHER): Payer: Medicaid Other | Admitting: Obstetrics and Gynecology

## 2021-05-02 ENCOUNTER — Encounter: Payer: Self-pay | Admitting: Obstetrics and Gynecology

## 2021-05-02 VITALS — BP 112/69 | HR 113 | Wt 153.5 lb

## 2021-05-02 DIAGNOSIS — Z148 Genetic carrier of other disease: Secondary | ICD-10-CM

## 2021-05-02 DIAGNOSIS — O24410 Gestational diabetes mellitus in pregnancy, diet controlled: Secondary | ICD-10-CM

## 2021-05-02 DIAGNOSIS — Z3492 Encounter for supervision of normal pregnancy, unspecified, second trimester: Secondary | ICD-10-CM

## 2021-05-02 NOTE — Patient Instructions (Signed)
Gestational Diabetes Mellitus, Diagnosis Gestational diabetes mellitus is a form of diabetes. It can happen when you are pregnant. The diabetes goes away after you give birth. If you do not get treated for this condition, it may cause problems for you or your baby. What are the causes? This condition is caused by changes in your body when you are pregnant. When these happen: A part of the body called the pancreas does not make enough insulin. The body cannot use insulin in the right way. Sugars cannot get into cells in your body. The sugars stay in your blood. This leads to high blood sugar. What increases the risk? Being older than age 25 when pregnant. Having someone with diabetes in your family. Too much body weight. Having had this condition in the past. Polycystic ovary syndrome. Being pregnant with more than one baby. What are the signs or symptoms? Being thirsty often. Being hungry often. Needing to pee more often. How is this treated? Eat a healthy diet. Get more exercise. Check your blood sugar often. Take insulin and other medicines, if needed. Work with an expert on this condition, if told. Follow these instructions at home: Learn about your diabetes Ask your doctor: How often should I check my blood sugar? Where do I get the equipment? What medicines do I need? When should I take them? Do I need to meet with an educator? Who can I call if I have questions? Where can I find a support group? General instructions Take medicines only as told by your doctor. Stay at a healthy weight. Drink enough fluid to keep your pee pale yellow. Wear an alert bracelet or carry a card that shows you have this condition. Keep all follow-up visits. Where to find more information American Diabetes Association (ADA): diabetes.org Association of Diabetes Care & Education Specialists (ADCES): diabeteseducator.org Centers for Disease Control and Prevention (CDC): cdc.gov American  Pregnancy Association: americanpregnancy.org U.[place]. Department of Agriculture MyPlate: myplate.gov Contact a doctor if: Your blood sugar is at or above 240 mg/dL (13.3 mmol/L). Your blood sugar is at or above 200 mg/dL (11.1 mmol/L) and you have ketones in your pee. You have a fever. You are sick for 2 days or more and you do not get better. You have either of these problems for more than 6 hours: You vomit every time you eat or drink. You have watery poop (diarrhea). Get help right away if: You cannot think clearly. You are not breathing well. You have a lot of ketones in your pee. Your baby seems to move less than normal. Abnormal fluid or blood starts to come out of your vagina. You start having contractions before your due date. You may feel your belly tighten. You have a very bad headache. These symptoms may be an emergency. Get help right away. Call your local emergency services (911 in the [place] Do not wait to see if the symptoms will go away. Do not drive yourself to the hospital. Summary Gestational diabetes is a form of diabetes. It can happen when you are pregnant. This condition occurs when your body cannot make or use insulin in the right way. Eat a healthy diet, exercise, and use medicines or insulin as told by your doctor. Tell your doctor if your blood sugar is high, you have a fever, or you vomit every time you eat or drink. Get help right away if you cannot think clearly, you are not breathing well, or your baby seems to move less than normal. This information   is not intended to replace advice given to you by your health care provider. Make sure you discuss any questions you have with your health care provider. Document Revised: 12/22/2019 Document Reviewed: 12/22/2019 Elsevier Patient Education  2022 [employer]. Third Trimester of Pregnancy The third trimester of pregnancy is from week 28 through week 40. This is months 7 through 9. The third trimester is a time  when the unborn baby (fetus) is growing rapidly. At the end of the ninth month, the fetus is about 20 inches long and weighs 6-10 pounds. Body changes during your third trimester During the third trimester, your body will continue to go through many changes. The changes vary and generally return to normal after your baby is born. Physical changes Your weight will continue to increase. You can expect to gain 25-35 pounds (11-16 kg) by the end of the pregnancy if you begin pregnancy at a normal weight. If you are underweight, you can expect to gain 28-40 lb (about 13-18 kg), and if you are overweight, you can expect to gain 15-25 lb (about 7-11 kg). You may begin to get stretch marks on your hips, abdomen, and breasts. Your breasts will continue to grow and may hurt. A yellow fluid (colostrum) may leak from your breasts. This is the first milk you are producing for your baby. You may have changes in your hair. These can include thickening of your hair, rapid growth, and changes in texture. Some people also have hair loss during or after pregnancy, or hair that feels dry or thin. Your belly button may stick out. You may notice more swelling in your hands, face, or ankles. Health changes You may have heartburn. You may have constipation. You may develop hemorrhoids. You may develop swollen, bulging veins (varicose veins) in your legs. You may have increased body aches in the pelvis, back, or thighs. This is due to weight gain and increased hormones that are relaxing your joints. You may have increased tingling or numbness in your hands, arms, and legs. The skin on your abdomen may also feel numb. You may feel short of breath because of your expanding uterus. Other changes You may urinate more often because the fetus is moving lower into your pelvis and pressing on your bladder. You may have more problems sleeping. This may be caused by the size of your abdomen, an increased need to urinate, and an  increase in your body's metabolism. You may notice the fetus "dropping," or moving lower in your abdomen (lightening). You may have increased vaginal discharge. You may notice that you have pain around your pelvic bone as your uterus distends. Follow these instructions at home: Medicines Follow your health care provider's instructions regarding medicine use. Specific medicines may be either safe or unsafe to take during pregnancy. Do not take any medicines unless approved by your health care provider. Take a prenatal vitamin that contains at least 600 micrograms (mcg) of folic acid. Eating and drinking Eat a healthy diet that includes fresh fruits and vegetables, whole grains, good sources of protein such as meat, eggs, or tofu, and low-fat dairy products. Avoid raw meat and unpasteurized juice, milk, and cheese. These carry germs that can harm you and your baby. Eat 4 or 5 small meals rather than 3 large meals a day. You may need to take these actions to prevent or treat constipation: Drink enough fluid to keep your urine pale yellow. Eat foods that are high in fiber, such as beans, whole grains, and fresh  fruits and vegetables. Limit foods that are high in fat and processed sugars, such as fried or sweet foods. Activity Exercise only as directed by your health care provider. Most people can continue their usual exercise routine during pregnancy. Try to exercise for 30 minutes at least 5 days a week. Stop exercising if you experience contractions in the uterus. Stop exercising if you develop pain or cramping in the lower abdomen or lower back. Avoid heavy lifting. Do not exercise if it is very hot or humid or if you are at a high altitude. If you choose to, you may continue to have sex unless your health care provider tells you not to. Relieving pain and discomfort Take frequent breaks and rest with your legs raised (elevated) if you have leg cramps or low back pain. Take warm sitz baths to  soothe any pain or discomfort caused by hemorrhoids. Use hemorrhoid cream if your health care provider approves. Wear a supportive bra to prevent discomfort from breast tenderness. If you develop varicose veins: Wear support hose as told by your health care provider. Elevate your feet for 15 minutes, 3-4 times a day. Limit salt in your diet. Safety Talk to your health care provider before traveling far distances. Do not use hot tubs, steam rooms, or saunas. Wear your seat belt at all times when driving or riding in a car. Talk with your health care provider if someone is verbally or physically abusive to you. Preparing for birth To prepare for the arrival of your baby: Take prenatal classes to understand, practice, and ask questions about labor and delivery. Visit the hospital and tour the maternity area. Purchase a rear-facing car seat and make sure you know how to install it in your car. Prepare the baby's room or sleeping area. Make sure to remove all pillows and stuffed animals from the baby's crib to prevent suffocation. General instructions Avoid cat litter boxes and soil used by cats. These carry germs that can cause birth defects in the baby. If you have a cat, ask someone to clean the litter box for you. Do not douche or use tampons. Do not use scented sanitary pads. Do not use any products that contain nicotine or tobacco, such as cigarettes, e-cigarettes, and chewing tobacco. If you need help quitting, ask your health care provider. Do not use any herbal remedies, illegal drugs, or medicines that were not prescribed to you. Chemicals in these products can harm your baby. Do not drink alcohol. You will have more frequent prenatal exams during the third trimester. During a routine prenatal visit, your health care provider will do a physical exam, perform tests, and discuss your overall health. Keep all follow-up visits. This is important. Where to find more information American  Pregnancy Association: americanpregnancy.org [employer] of Obstetricians and Gynecologists: https://www.todd-brady.net/ Office on [employer] Health: MightyReward.co.nz Contact a health care provider if you have: A fever. Mild pelvic cramps, pelvic pressure, or nagging pain in your abdominal area or lower back. Vomiting or diarrhea. Bad-smelling vaginal discharge or foul-smelling urine. Pain when you urinate. A headache that does not go away when you take medicine. Visual changes or see spots in front of your eyes. Get help right away if: Your water breaks. You have regular contractions less than 5 minutes apart. You have spotting or bleeding from your vagina. You have severe abdominal pain. You have difficulty breathing. You have chest pain. You have fainting spells. You have not felt your baby move for the time period told by  your health care provider. You have new or increased pain, swelling, or redness in an arm or leg. Summary The third trimester of pregnancy is from week 28 through week 40 (months 7 through 9). You may have more problems sleeping. This can be caused by the size of your abdomen, an increased need to urinate, and an increase in your body's metabolism. You will have more frequent prenatal exams during the third trimester. Keep all follow-up visits. This is important. This information is not intended to replace advice given to you by your health care provider. Make sure you discuss any questions you have with your health care provider. Document Revised: 12/24/2019 Document Reviewed: 10/30/2019 Elsevier Patient Education  2022 [employer].

## 2021-05-02 NOTE — Progress Notes (Signed)
Subjective:  Kathryn Silva is a 25 y.o. G3P1011 at [redacted]w[redacted]d being seen today for ongoing prenatal care.  She is currently monitored for the following issues for this high-risk pregnancy and has History of first trimester miscarriage, currently pregnant; Supervision of low-risk pregnancy, second trimester; Carrier of spinal muscular atrophy; and Gestational diabetes on their problem list.  Patient reports no complaints.  Contractions: Not present. Vag. Bleeding: None.  Movement: Present. Denies leaking of fluid.   The following portions of the patient's history were reviewed and updated as appropriate: allergies, current medications, past family history, past medical history, past social history, past surgical history and problem list. Problem list updated.  Objective:   Vitals:   05/02/21 0937  BP: 112/69  Pulse: (!) 113  Weight: 153 lb 8 oz (69.6 kg)    Fetal Status: Fetal Heart Rate (bpm): 154   Movement: Present     General:  Alert, oriented and cooperative. Patient is in no acute distress.  Skin: Skin is warm and dry. No rash noted.   Cardiovascular: Normal heart rate noted  Respiratory: Normal respiratory effort, no problems with respiration noted  Abdomen: Soft, gravid, appropriate for gestational age. Pain/Pressure: Present     Pelvic:  Cervical exam deferred        Extremities: Normal range of motion.  Edema: None  Mental Status: Normal mood and affect. Normal behavior. Normal judgment and thought content.   Urinalysis:      Assessment and Plan:  Pregnancy: G3P1011 at [redacted]w[redacted]d  1. Supervision of low-risk pregnancy, second trimester Stable  2. Diet controlled gestational diabetes mellitus (GDM) in third trimester GDM and pregnancy reviewed with pt Reduction of risks with good glycemic control reviewed. Growth scan later this week Referral to DM educator has been made  3. Carrier of spinal muscular atrophy Stable Declined partner testing  Preterm labor symptoms and general  obstetric precautions including but not limited to vaginal bleeding, contractions, leaking of fluid and fetal movement were reviewed in detail with the patient. Please refer to After Visit Summary for other counseling recommendations.  Return in about 2 weeks (around 05/16/2021) for OB visit, face to face, MD only.   Hermina Staggers, MD

## 2021-05-04 ENCOUNTER — Ambulatory Visit: Payer: Medicaid Other | Admitting: *Deleted

## 2021-05-04 ENCOUNTER — Encounter: Payer: Self-pay | Admitting: *Deleted

## 2021-05-04 ENCOUNTER — Other Ambulatory Visit: Payer: Self-pay

## 2021-05-04 ENCOUNTER — Ambulatory Visit: Payer: Medicaid Other | Attending: Obstetrics and Gynecology

## 2021-05-04 VITALS — BP 120/66 | HR 111

## 2021-05-04 DIAGNOSIS — Z3492 Encounter for supervision of normal pregnancy, unspecified, second trimester: Secondary | ICD-10-CM | POA: Insufficient documentation

## 2021-05-04 DIAGNOSIS — E669 Obesity, unspecified: Secondary | ICD-10-CM

## 2021-05-04 DIAGNOSIS — O99212 Obesity complicating pregnancy, second trimester: Secondary | ICD-10-CM | POA: Diagnosis not present

## 2021-05-04 DIAGNOSIS — Z3689 Encounter for other specified antenatal screening: Secondary | ICD-10-CM | POA: Diagnosis present

## 2021-05-04 DIAGNOSIS — Z148 Genetic carrier of other disease: Secondary | ICD-10-CM | POA: Diagnosis present

## 2021-05-04 DIAGNOSIS — Z3A31 31 weeks gestation of pregnancy: Secondary | ICD-10-CM | POA: Diagnosis not present

## 2021-05-05 ENCOUNTER — Other Ambulatory Visit: Payer: Self-pay | Admitting: *Deleted

## 2021-05-05 DIAGNOSIS — O24410 Gestational diabetes mellitus in pregnancy, diet controlled: Secondary | ICD-10-CM

## 2021-05-18 ENCOUNTER — Encounter: Payer: Self-pay | Admitting: Registered"

## 2021-05-18 ENCOUNTER — Other Ambulatory Visit: Payer: Self-pay

## 2021-05-18 ENCOUNTER — Encounter: Payer: Medicaid Other | Attending: Family Medicine | Admitting: Registered"

## 2021-05-18 DIAGNOSIS — O24410 Gestational diabetes mellitus in pregnancy, diet controlled: Secondary | ICD-10-CM | POA: Diagnosis not present

## 2021-05-18 NOTE — Progress Notes (Signed)
Patient was seen on 05/18/21 for Gestational Diabetes self-management class at [facility]. The following learning objectives were met by the patient during this course:  States the definition of Gestational Diabetes States why dietary management is important in controlling blood glucose Describes the effects each nutrient has on blood glucose levels Demonstrates ability to create a balanced meal plan Demonstrates carbohydrate counting  States when to check blood glucose levels Demonstrates proper blood glucose monitoring techniques States the effect of stress and exercise on blood glucose levels States the importance of limiting caffeine and abstaining from alcohol and smoking  Blood glucose monitor given: Accu-[employer] Guide Me Lot #[id number] Exp: 06/08/2022 CBG: 104 mg/dL   Patient instructed to monitor glucose levels: FBS: 60 - <95; 1 hour: <140; 2 hour: <120  Patient received handouts: Nutrition Diabetes and Pregnancy, including carb counting list  Patient will be seen for follow-up as needed.

## 2021-05-20 ENCOUNTER — Ambulatory Visit (INDEPENDENT_AMBULATORY_CARE_PROVIDER_SITE_OTHER): Payer: Medicaid Other | Admitting: Obstetrics & Gynecology

## 2021-05-20 ENCOUNTER — Other Ambulatory Visit: Payer: Self-pay

## 2021-05-20 VITALS — BP 113/71 | HR 105 | Wt 153.4 lb

## 2021-05-20 DIAGNOSIS — O0993 Supervision of high risk pregnancy, unspecified, third trimester: Secondary | ICD-10-CM

## 2021-05-20 DIAGNOSIS — O24410 Gestational diabetes mellitus in pregnancy, diet controlled: Secondary | ICD-10-CM

## 2021-05-20 DIAGNOSIS — Z3A33 33 weeks gestation of pregnancy: Secondary | ICD-10-CM

## 2021-05-20 NOTE — Progress Notes (Signed)
   PRENATAL VISIT NOTE  Subjective:  Kathryn Silva is a 25 y.o. G3P1011 at [redacted]w[redacted]d being seen today for ongoing prenatal care.  She is currently monitored for the following issues for this high-risk pregnancy and has History of first trimester miscarriage, currently pregnant; Supervision of high risk pregnancy in third trimester; Carrier of spinal muscular atrophy; and Gestational diabetes on their problem list.  Patient reports no complaints.  Contractions: Not present. Vag. Bleeding: None.  Movement: Present. Denies leaking of fluid.   The following portions of the patient's history were reviewed and updated as appropriate: allergies, current medications, past family history, past medical history, past social history, past surgical history and problem list.   Objective:   Vitals:   05/20/21 0818  BP: 113/71  Pulse: (!) 105  Weight: 153 lb 6.4 oz (69.6 kg)    Fetal Status: Fetal Heart Rate (bpm): 146 Fundal Height: 34 cm Movement: Present     General:  Alert, oriented and cooperative. Patient is in no acute distress.  Skin: Skin is warm and dry. No rash noted.   Cardiovascular: Normal heart rate noted  Respiratory: Normal respiratory effort, no problems with respiration noted  Abdomen: Soft, gravid, appropriate for gestational age.  Pain/Pressure: Absent     Pelvic: Cervical exam deferred        Extremities: Normal range of motion.  Edema: None  Mental Status: Normal mood and affect. Normal behavior. Normal judgment and thought content.   Assessment and Plan:  Pregnancy: G3P1011 at [redacted]w[redacted]d 1. Diet controlled gestational diabetes mellitus (GDM) in third trimester Reports sugars within range. Did not bring log. Will take picture and send via MyChart. Continue diet control. Growth scan already scheduled.  2. [redacted] weeks gestation of pregnancy 3. Supervision of high risk pregnancy in third trimester No other concerns. Preterm labor symptoms and general obstetric precautions including but  not limited to vaginal bleeding, contractions, leaking of fluid and fetal movement were reviewed in detail with the patient. Please refer to After Visit Summary for other counseling recommendations.   Return in about 2 weeks (around 06/03/2021) for OFFICE OB VISIT (MD only).  Future Appointments  Date Time Provider Department Center  06/01/2021 10:45 AM [facility] NURSE [facility]  06/01/2021 11:00 AM [facility] US1 [facility]    Jaynie Collins, MD

## 2021-06-01 ENCOUNTER — Encounter: Payer: Self-pay | Admitting: *Deleted

## 2021-06-01 ENCOUNTER — Ambulatory Visit: Payer: Medicaid Other | Admitting: *Deleted

## 2021-06-01 ENCOUNTER — Other Ambulatory Visit: Payer: Self-pay

## 2021-06-01 ENCOUNTER — Ambulatory Visit: Payer: Medicaid Other | Attending: Maternal & Fetal Medicine

## 2021-06-01 VITALS — BP 114/62 | HR 91

## 2021-06-01 DIAGNOSIS — Z148 Genetic carrier of other disease: Secondary | ICD-10-CM | POA: Insufficient documentation

## 2021-06-01 DIAGNOSIS — O24410 Gestational diabetes mellitus in pregnancy, diet controlled: Secondary | ICD-10-CM | POA: Insufficient documentation

## 2021-06-01 DIAGNOSIS — O0993 Supervision of high risk pregnancy, unspecified, third trimester: Secondary | ICD-10-CM

## 2021-06-01 DIAGNOSIS — Z3A35 35 weeks gestation of pregnancy: Secondary | ICD-10-CM | POA: Diagnosis not present

## 2021-06-01 DIAGNOSIS — O99213 Obesity complicating pregnancy, third trimester: Secondary | ICD-10-CM

## 2021-06-01 DIAGNOSIS — E669 Obesity, unspecified: Secondary | ICD-10-CM | POA: Diagnosis not present

## 2021-06-06 ENCOUNTER — Other Ambulatory Visit (HOSPITAL_COMMUNITY)
Admission: RE | Admit: 2021-06-06 | Discharge: 2021-06-06 | Disposition: A | Discharge status: Home / Self Care | Payer: Medicaid Other | Source: Ambulatory Visit | Attending: Obstetrics and Gynecology | Admitting: Obstetrics and Gynecology

## 2021-06-06 ENCOUNTER — Other Ambulatory Visit: Payer: Self-pay

## 2021-06-06 ENCOUNTER — Ambulatory Visit (INDEPENDENT_AMBULATORY_CARE_PROVIDER_SITE_OTHER): Payer: Medicaid Other | Admitting: Obstetrics and Gynecology

## 2021-06-06 VITALS — BP 107/68 | HR 101 | Wt 152.5 lb

## 2021-06-06 DIAGNOSIS — O24410 Gestational diabetes mellitus in pregnancy, diet controlled: Secondary | ICD-10-CM

## 2021-06-06 DIAGNOSIS — O0993 Supervision of high risk pregnancy, unspecified, third trimester: Secondary | ICD-10-CM

## 2021-06-06 NOTE — Progress Notes (Signed)
   PRENATAL VISIT NOTE  Subjective:  Kathryn Silva is a 25 y.o. G3P1011 at [redacted]w[redacted]d being seen today for ongoing prenatal care.  She is currently monitored for the following issues for this low-risk pregnancy and has Supervision of high risk pregnancy in third trimester; Carrier of spinal muscular atrophy; and Gestational diabetes on their problem list.  Patient reports no complaints.  Contractions: Not present. Vag. Bleeding: None.  Movement: Present. Denies leaking of fluid.   The following portions of the patient's history were reviewed and updated as appropriate: allergies, current medications, past family history, past medical history, past social history, past surgical history and problem list.   Objective:   Vitals:   06/06/21 1031  BP: 107/68  Pulse: (!) 101  Weight: 152 lb 8 oz (69.2 kg)    Fetal Status: Fetal Heart Rate (bpm): 154 Fundal Height: 36 cm Movement: Present  Presentation: Vertex  General:  Alert, oriented and cooperative. Patient is in no acute distress.  Skin: Skin is warm and dry. No rash noted.   Cardiovascular: Normal heart rate noted  Respiratory: Normal respiratory effort, no problems with respiration noted  Abdomen: Soft, gravid, appropriate for gestational age.  Pain/Pressure: Present     Pelvic: Cervical exam performed in the presence of a chaperone Dilation: 1 Effacement (%): Thick Station: Ballotable  Extremities: Normal range of motion.  Edema: None  Mental Status: Normal mood and affect. Normal behavior. Normal judgment and thought content.   Assessment and Plan:  Pregnancy: G3P1011 at [redacted]w[redacted]d 1. Diet controlled gestational diabetes mellitus (GDM) in third trimester - No log again, and did not take a picture of it last time because she couldn't get in her mychart.  CBGs per pt are fastings in the 70s-80s and meals are below 120 for 2 hr pp. Encouraged her to do this today and send [place] a picture for our review. Control is likely good given growth, but would  like to confirm.  - Growth on 11/2 was wnl - 25%ile, symmetric, cephalic, normal AFI - Reviewed delivery 39-40w  2. Supervision of high risk pregnancy in third trimester - GBS testing today - Declines flu shot  Preterm labor symptoms and general obstetric precautions including but not limited to vaginal bleeding, contractions, leaking of fluid and fetal movement were reviewed in detail with the patient. Please refer to After Visit Summary for other counseling recommendations.   Return in about 1 week (around 06/13/2021) for OB VISIT, MD or APP.  No future appointments.   Milas Hock, MD

## 2021-06-07 LAB — GC/CHLAMYDIA PROBE AMP (~~LOC~~) NOT AT ARMC
Chlamydia: NEGATIVE
Comment: NEGATIVE
Comment: NORMAL
Neisseria Gonorrhea: NEGATIVE

## 2021-06-09 LAB — CULTURE, BETA STREP (GROUP B ONLY): Strep Gp B Culture: POSITIVE — AB

## 2021-06-13 ENCOUNTER — Ambulatory Visit (INDEPENDENT_AMBULATORY_CARE_PROVIDER_SITE_OTHER): Payer: Medicaid Other | Admitting: Family Medicine

## 2021-06-13 ENCOUNTER — Other Ambulatory Visit: Payer: Self-pay

## 2021-06-13 VITALS — BP 112/70 | HR 94 | Wt 154.5 lb

## 2021-06-13 DIAGNOSIS — O24410 Gestational diabetes mellitus in pregnancy, diet controlled: Secondary | ICD-10-CM

## 2021-06-13 DIAGNOSIS — O0993 Supervision of high risk pregnancy, unspecified, third trimester: Secondary | ICD-10-CM

## 2021-06-13 NOTE — Progress Notes (Signed)
   PRENATAL VISIT NOTE  Subjective:  Kathryn Silva is a 25 y.o. G3P1011 at [redacted]w[redacted]d being seen today for ongoing prenatal care.  She is currently monitored for the following issues for this high-risk pregnancy and has Supervision of high risk pregnancy in third trimester; Carrier of spinal muscular atrophy; and Gestational diabetes on their problem list.  Patient reports no complaints.  Contractions: Not present. Vag. Bleeding: None.  Movement: Present. Denies leaking of fluid.    Fastings- 62-96 (only 1 above 95) 2hr      B 97-120 (only 2 at 120)  L 92-126 (only only 1 above 126) D 107-147 ( only 5 of 18 above 120)  The following portions of the patient's history were reviewed and updated as appropriate: allergies, current medications, past family history, past medical history, past social history, past surgical history and problem list.   Objective:   Vitals:   06/13/21 1024  BP: 112/70  Pulse: 94  Weight: 154 lb 8 oz (70.1 kg)    Fetal Status: Fetal Heart Rate (bpm): 143 Fundal Height: 37 cm Movement: Present     General:  Alert, oriented and cooperative. Patient is in no acute distress.  Skin: Skin is warm and dry. No rash noted.   Cardiovascular: Normal heart rate noted  Respiratory: Normal respiratory effort, no problems with respiration noted  Abdomen: Soft, gravid, appropriate for gestational age.  Pain/Pressure: Present     Pelvic: Cervical exam deferred        Extremities: Normal range of motion.  Edema: None  Mental Status: Normal mood and affect. Normal behavior. Normal judgment and thought content.   Assessment and Plan:  Pregnancy: G3P1011 at [redacted]w[redacted]d 1. Diet controlled gestational diabetes mellitus (GDM) in third trimester IOL at 40 wk, request at 38 weeks Discussed reason for IOL and process briefly Well controlled, no need to start meds EFW=2435g, 25th% at 35 wks  2. Supervision of high risk pregnancy in third trimester Up to date Patient is ready for infant at  home. Brought her son to appt today! He is going to 5 in Dec!  Term labor symptoms and general obstetric precautions including but not limited to vaginal bleeding, contractions, leaking of fluid and fetal movement were reviewed in detail with the patient. Please refer to After Visit Summary for other counseling recommendations.   Return in about 1 week (around 06/20/2021).  Future Appointments  Date Time Provider Department Center  06/22/2021  9:55 AM Anyanwu, Jethro Bastos, MD [facility]  06/27/2021  2:35 PM Hermina Staggers, MD [facility]    Federico Flake, MD

## 2021-06-22 ENCOUNTER — Other Ambulatory Visit: Payer: Self-pay

## 2021-06-22 ENCOUNTER — Ambulatory Visit (INDEPENDENT_AMBULATORY_CARE_PROVIDER_SITE_OTHER): Payer: Medicaid Other | Admitting: Obstetrics & Gynecology

## 2021-06-22 VITALS — BP 120/71 | HR 88 | Wt 154.7 lb

## 2021-06-22 DIAGNOSIS — O0993 Supervision of high risk pregnancy, unspecified, third trimester: Secondary | ICD-10-CM

## 2021-06-22 DIAGNOSIS — Z3A38 38 weeks gestation of pregnancy: Secondary | ICD-10-CM

## 2021-06-22 DIAGNOSIS — O24410 Gestational diabetes mellitus in pregnancy, diet controlled: Secondary | ICD-10-CM

## 2021-06-22 NOTE — Progress Notes (Signed)
PRENATAL VISIT NOTE  Subjective:  Kathryn Silva is a 25 y.o. G3P1011 at [redacted]w[redacted]d being seen today for ongoing prenatal care.  She is currently monitored for the following issues for this high-risk pregnancy and has Supervision of high risk pregnancy in third trimester; Carrier of spinal muscular atrophy; and Gestational diabetes on their problem list.  Patient reports no complaints.  Contractions: Irritability. Vag. Bleeding: None.  Movement: Present. Denies leaking of fluid.   The following portions of the patient's history were reviewed and updated as appropriate: allergies, current medications, past family history, past medical history, past social history, past surgical history and problem list.   Objective:   Vitals:   06/22/21 0947  BP: 120/71  Pulse: 88  Weight: 154 lb 11.2 oz (70.2 kg)    Fetal Status: Fetal Heart Rate (bpm): 143   Movement: Present     General:  Alert, oriented and cooperative. Patient is in no acute distress.  Skin: Skin is warm and dry. No rash noted.   Cardiovascular: Normal heart rate noted  Respiratory: Normal respiratory effort, no problems with respiration noted  Abdomen: Soft, gravid, appropriate for gestational age.  Pain/Pressure: Present     Pelvic: Cervical exam deferred        Extremities: Normal range of motion.  Edema: None  Mental Status: Normal mood and affect. Normal behavior. Normal judgment and thought content.   [place] MFM OB FOLLOW UP  Result Date: 06/01/2021 ----------------------------------------------------------------------  OBSTETRICS REPORT                       (Signed Final 06/01/2021 12:04 pm) ---------------------------------------------------------------------- Patient Info  ID #:       [id number]                          D.O.B.:  1996/05/07 (24 yrs)  Name:       [facility]                    Visit Date: 06/01/2021 10:58 am ---------------------------------------------------------------------- Performed By  Attending:         Lin Landsman      Ref. Address:     [address]                    [place]                                                             [place], [place]                                                             [address]  Performed By:     Earley Brooke     Location:         [facility]                    BS, RDMS  [facility] at                                                             [place] [facility]                                                             [facility]  Referred By:      [facility]                    for [facility] ---------------------------------------------------------------------- Orders  #  Description                           Code        Ordered By  1  [place] MFM OB FOLLOW UP                   [phone]    Sander Nephew ----------------------------------------------------------------------  #  Order #                     Accession #                Episode #  1  [id number]                   [id number]                 [id number] ---------------------------------------------------------------------- Indications  Gestational diabetes in pregnancy, diet        O24.410  controlled  [redacted] weeks gestation of pregnancy                [id number]  Obesity complicating pregnancy (BMI 30)        O99.210 E66.9  Asthma                                         O99.89 j45.909  LR NIPS, neg AFP ---------------------------------------------------------------------- Fetal Evaluation  Num Of Fetuses:         1  Fetal Heart Rate(bpm):  149  Cardiac Activity:       Observed  Presentation:           Cephalic  Placenta:               Anterior  P. Cord Insertion:      Previously Visualized  Amniotic Fluid  AFI FV:      Within normal limits  AFI Sum(cm)     %Tile       Largest Pocket(cm)  17.6            65  8.5  RUQ(cm)       RLQ(cm)       LUQ(cm)        LLQ(cm)  8.5           2.8           1.7            4.6  ---------------------------------------------------------------------- Biometry  BPD:      85.9  mm     G. Age:  34w 4d         35  %    CI:        81.22   %    70 - 86                                                          FL/HC:      23.0   %    20.1 - 22.3  HC:      300.9  mm     G. Age:  33w 3d        1.1  %    HC/AC:      1.00        0.93 - 1.11  AC:      300.9  mm     G. Age:  34w 0d         23  %    FL/BPD:     80.7   %    71 - 87  FL:       69.3  mm     G. Age:  35w 4d         51  %    FL/AC:      23.0   %    20 - 24  HUM:        60  mm     G. Age:  34w 6d         55  %  LV:        4.9  mm  Est. FW:    2435  gm      5 lb 6 oz     25  % ---------------------------------------------------------------------- OB History  Blood Type:   O+  Gravidity:    3         Term:   1         SAB:   1 ---------------------------------------------------------------------- Gestational Age  LMP:           38w 2d        Date:  09/06/20                 EDD:   06/13/21  U/S Today:     34w 3d                                        EDD:   07/10/21  Best:          35w 2d     Det. By:  U/S  (01/17/21)          EDD:   07/04/21 ---------------------------------------------------------------------- Anatomy  Cranium:               Appears normal  LVOT:                   Previously seen  Cavum:                 Previously seen        Aortic Arch:            Previously seen  Ventricles:            Appears normal         Ductal Arch:            Previously seen  Choroid Plexus:        Previously seen        Diaphragm:              Previously seen  Cerebellum:            Previously seen        Stomach:                Appears normal, left                                                                        sided  Posterior Fossa:       Previously seen        Abdomen:                Appears normal  Nuchal Fold:           Previously seen        Abdominal Wall:         Previously seen  Face:                  Orbits and profile     Cord  Vessels:           Previously seen                         previously seen  Lips:                  Previously seen        Kidneys:                Appear normal  Palate:                Previously seen        Bladder:                Appears normal  Thoracic:              Previously seen        Spine:                  Previously seen  Heart:                 Appears normal         Upper Extremities:      Previously seen                         (4CH, axis, and  situs)  RVOT:                  Previously seen        Lower Extremities:      Previously seen  Other:  VC, 3VV and 3VTV previously visualized. ---------------------------------------------------------------------- Cervix Uterus Adnexa  Cervix  Not visualized (advanced GA >24wks)  Right Ovary  Previously seen  Left Ovary  Previously seen. ---------------------------------------------------------------------- Impression  Follow up growth due to A1GDM  Normal interval growth with measurements consistent with  dates  Good fetal movement and amniotic fluid volume  Ms. Evett reports good blood sugars and she remains diet  controlled.  I explained that a if medical therapy is required then weekly  testing should be initiated. ---------------------------------------------------------------------- Recommendations  Follow up growth as clinically indicated.  Delivery between 39-40 weeks. ----------------------------------------------------------------------               Sander Nephew, MD Electronically Signed Final Report   06/01/2021 12:04 pm ----------------------------------------------------------------------   Assessment and Plan:  Pregnancy: [id number] at [redacted]w[redacted]d 1. Diet controlled gestational diabetes mellitus (GDM) in third trimester Reports normal range CBGs, did not bring log.  IOL scheduled on 07/01/21 as per patient request.  Risks and benefits of induction were reviewed, including failure of method, prolonged labor, need for further  intervention, risk of cesarean section.  Patient understand these risks and wish to proceed.  Induction of labor scheduled on 07/01/21 at midnight, orders have been signed and held. Offered outpatient foley placement for cervical ripening, she will consider this, information was given to her to review at home. She was told to expect a call from [facility] L&D with further instructions about her pre-admission COVID screening and any further instructions about the induction of labor. She was told that the only scheduled time slot was midnight, patients are called in during the morning between 6:30am -9 am to come in for their induction as soon as the rooms and staff are ready for them.    2. [redacted] weeks gestation of pregnancy 3. Supervision of high risk pregnancy in third trimester Term labor symptoms and general obstetric precautions including but not limited to vaginal bleeding, contractions, leaking of fluid and fetal movement were reviewed in detail with the patient. Please refer to After Visit Summary for other counseling recommendations.   Return in about 8 days (around 06/30/2021) for OFFICE OB VISIT (MD only), possible foley placement.  Future Appointments  Date Time Provider [place]  06/27/2021  2:35 PM Chancy Milroy, MD [facility]  07/01/2021 12:00 AM [facility] SCHED ROOM [facility]IN[facility] None    Verita Schneiders, MD

## 2021-06-22 NOTE — Patient Instructions (Addendum)
Return to office for any scheduled appointments. Call the office or go to the MAU at [facility] if: You begin to have strong, frequent contractions Your water breaks.  Sometimes it is a big gush of fluid, sometimes it is just a trickle that keeps getting your panties wet or running down your legs You have vaginal bleeding.  It is normal to have a small amount of spotting if your cervix was checked.  You do not feel your baby moving like normal.  If you do not, get something to eat and drink and lay down and focus on feeling your baby move.   If your baby is still not moving like normal, you should call the office or go to MAU. Any other obstetric concerns.  Labor Induction scheduled at 07/01/21 at midnight Labor induction is when steps are taken to cause a pregnant woman to begin the labor process. Most women go into labor on their own between 37 weeks and 42 weeks of pregnancy. When this does not happen, or when there is a medical need for labor to begin, steps may be taken to induce, or bring on, labor. Labor induction causes a pregnant woman's uterus to contract. It also causes the cervix to soften (ripen), open (dilate), and thin out. Usually, labor is not induced before 39 weeks of pregnancy unless there is a medical reason to do so. When is labor induction considered? Labor induction may be right for you if: Your pregnancy lasts longer than 41 to 42 weeks. Your placenta is separating from your uterus (placental abruption). You have a rupture of membranes and your labor does not begin. You have health problems, like diabetes or high blood pressure (preeclampsia) during your pregnancy. Your baby has stopped growing or does not have enough amniotic fluid. Before labor induction begins, your health care provider will consider the following factors: Your medical condition and the baby's condition. How many weeks you have been pregnant. How mature the baby's lungs  are. The condition of your cervix. The position of the baby. The size of your birth canal. Tell a health care provider about: Any allergies you have. All medicines you are taking, including vitamins, herbs, eye drops, creams, and over-the-counter medicines. Any problems you or your family members have had with anesthetic medicines. Any surgeries you have had. Any blood disorders you have. Any medical conditions you have. What are the risks? Generally, this is a safe procedure. However, problems may occur, including: Failed induction. Changes in fetal heart rate, such as being too high, too low, or irregular (erratic). Infection in the mother or the baby. Increased risk of having a cesarean delivery. Breaking off (abruption) of the placenta from the uterus. This is rare. Rupture of the uterus. This is very rare. Your baby could fail to get enough blood flow or oxygen. This can be life-threatening. When induction is needed for medical reasons, the benefits generally outweigh the risks. What happens during the procedure? During the procedure, your health care provider will use one of these methods to induce labor: Stripping the membranes. In this method, the amniotic sac tissue is gently separated from the cervix. This causes the following to happen: Your cervix stretches, which in turn causes the release of prostaglandins. Prostaglandins induce labor and cause the uterus to contract. This procedure is often done in an office visit. You will be sent home to wait for contractions to begin. Prostaglandin medicine. This medicine starts contractions and causes the cervix  to dilate and ripen. This can be taken by mouth (orally) or by being inserted into the vagina (suppository). Inserting a small, thin tube (catheter) with a balloon into the vagina and then expanding the balloon with water to dilate the cervix. Breaking the water. In this method, a small instrument is used to make a small hole  in the amniotic sac. This eventually causes the amniotic sac to break. Contractions should begin within a few hours. Medicine to trigger or strengthen contractions. This medicine is given through an IV that is inserted into a vein in your arm. This procedure may vary among health care providers and hospitals. Where to find more information March of D[employer]: www.marchofdimes.org [employer] of Obstetricians and Gynecologists: www.acog.org Summary Labor induction causes a pregnant woman's uterus to contract. It also causes the cervix to soften (ripen), open (dilate), and thin out. Labor is usually not induced before 39 weeks of pregnancy unless there is a medical reason to do so. When induction is needed for medical reasons, the benefits generally outweigh the risks. Talk with your health care provider about which methods of labor induction are right for you. This information is not intended to replace advice given to you by your health care provider. Make sure you discuss any questions you have with your health care provider. Document Revised: 04/29/2020 Document Reviewed: 04/29/2020 Elsevier Patient Education  2022 Elsevier Inc.   OUTPATIENT FOLEY BULB INDUCTION OF LABOR:  Information Sheet for Mothers and Family               What's a Foley Bulb Induction? A Foley bulb induction is a procedure where your provider inserts a catheter into your cervix. Once inside your womb, your provider inflates the balloon with a saline solution.   This puts pressure on your cervix and encourages dilation. The catheter falls out once your cervix dilates to 3-4 centimeters.     With any procedure, it's important that you know what to expect. The insertion of a Foley catheter can be a bit uncomfortable, and some women experience sharp pelvic pain. The pain may subside once the catheter is in place. You may experience some cramping when the Foley catheter is in place.  This is normal.     GO TO THE  MATERNITY ADMISSIONS UNIT FOR THE FOLLOWING: Heavy vaginal bleeding Rupture of membranes (fluid that wets your underwear) Painful uterine contractions every 5 minutes or less Severe abdominal discomfort Decreased movement of the baby

## 2021-06-25 ENCOUNTER — Other Ambulatory Visit: Payer: Self-pay | Admitting: Advanced Practice Midwife

## 2021-06-27 ENCOUNTER — Encounter: Payer: Medicaid Other | Admitting: Obstetrics and Gynecology

## 2021-06-30 ENCOUNTER — Other Ambulatory Visit: Payer: Self-pay

## 2021-06-30 ENCOUNTER — Encounter: Payer: Self-pay | Admitting: Obstetrics and Gynecology

## 2021-06-30 ENCOUNTER — Ambulatory Visit (INDEPENDENT_AMBULATORY_CARE_PROVIDER_SITE_OTHER): Payer: Medicaid Other | Admitting: Obstetrics and Gynecology

## 2021-06-30 VITALS — BP 109/72 | HR 116 | Wt 154.6 lb

## 2021-06-30 DIAGNOSIS — Z148 Genetic carrier of other disease: Secondary | ICD-10-CM

## 2021-06-30 DIAGNOSIS — O0993 Supervision of high risk pregnancy, unspecified, third trimester: Secondary | ICD-10-CM

## 2021-06-30 DIAGNOSIS — O24410 Gestational diabetes mellitus in pregnancy, diet controlled: Secondary | ICD-10-CM

## 2021-06-30 NOTE — Progress Notes (Signed)
Subjective:  Kathryn Silva is a 25 y.o. G3P1011 at [redacted]w[redacted]d being seen today for ongoing prenatal care.  She is currently monitored for the following issues for this high-risk pregnancy and has Supervision of high risk pregnancy in third trimester; Carrier of spinal muscular atrophy; and Gestational diabetes on their problem list.  Patient reports general discomfort sof pregnancy.  Contractions: Irritability.  .  Movement: Present. Denies leaking of fluid.   The following portions of the patient's history were reviewed and updated as appropriate: allergies, current medications, past family history, past medical history, past social history, past surgical history and problem list. Problem list updated.  Objective:   Vitals:   06/30/21 1428  BP: 109/72  Pulse: (!) 116  Weight: 154 lb 9.6 oz (70.1 kg)    Fetal Status: Fetal Heart Rate (bpm): 143   Movement: Present     General:  Alert, oriented and cooperative. Patient is in no acute distress.  Skin: Skin is warm and dry. No rash noted.   Cardiovascular: Normal heart rate noted  Respiratory: Normal respiratory effort, no problems with respiration noted  Abdomen: Soft, gravid, appropriate for gestational age. Pain/Pressure: Present     Pelvic:  Cervical exam performed        Extremities: Normal range of motion.  Edema: None  Mental Status: Normal mood and affect. Normal behavior. Normal judgment and thought content.   Urinalysis:      Assessment and Plan:  Pregnancy: G3P1011 at [redacted]w[redacted]d  1. Supervision of high risk pregnancy in third trimester Labor precautions  2. Diet controlled gestational diabetes mellitus (GDM) in third trimester CBG's in goal range For IOL tomorrow  3. Carrier of spinal muscular atrophy Partner declined testing  Term labor symptoms and general obstetric precautions including but not limited to vaginal bleeding, contractions, leaking of fluid and fetal movement were reviewed in detail with the patient. Please  refer to After Visit Summary for other counseling recommendations.  Return in about 4 weeks (around 07/28/2021) for PP visit 4 weeks from 07/02/21 with Glucola.   Hermina Staggers, MD

## 2021-06-30 NOTE — Patient Instructions (Signed)
Vaginal Delivery ?Vaginal delivery means that you give birth by pushing your baby out of your birth canal (vagina). Your health care team will help you before, during, and after vaginal delivery. ?Birth experiences are unique for every woman and every pregnancy, and birth experiences vary depending on where you choose to give birth. ?What are the risks and benefits? ?Generally, this is safe. However, problems may occur, including: ?Bleeding. ?Infection. ?Damage to other structures such as vaginal tearing. ?Allergic reactions to medicines. ?Despite the risks, benefits of vaginal delivery include less risk of bleeding and infection and a shorter recovery time compared to a Cesarean delivery. Cesarean delivery, or C-section, is the surgical delivery of a baby. ?What happens when I arrive at the birth center or hospital? ?Once you are in labor and have been admitted into the hospital or birth center, your health care team may: ?Review your pregnancy history and any concerns that you have. ?Talk with you about your birth plan and discuss pain control options. ?Check your blood pressure, breathing, and heartbeat. ?Assess your baby's heartbeat. ?Monitor your uterus for contractions. ?Check whether your bag of water (amniotic sac) has broken (ruptured). ?Insert an IV into one of your veins. This may be used to give you fluids and medicines. ?Monitoring ?Your health care team may assess your contractions (uterine monitoring) and your baby's heart rate (fetal monitoring). You may need to be monitored: ?Often, but not continuously (intermittently). ?All the time or for long periods at a time (continuously). Continuous monitoring may be needed if: ?You are taking certain medicines, such as medicine to relieve pain or make your contractions stronger. ?You have pregnancy or labor complications. ?Monitoring may be done by: ?Placing a special stethoscope or a handheld monitoring device on your abdomen to check your baby's heartbeat  and to check for contractions. ?Placing monitors on your abdomen (external monitors) to record your baby's heartbeat and the frequency and length of contractions. ?Placing monitors inside your uterus through your vagina (internal monitors) to record your baby's heartbeat and the frequency, length, and strength of your contractions. Depending on the type of monitor, it may remain in your uterus or on your baby's head until birth. ?Telemetry. This is a type of continuous monitoring that can be done with external or internal monitors. Instead of having to stay in bed, you are able to move around. ?Physical exam ?Your health care team may perform frequent physical exams. This may include: ?Checking how and where your baby is positioned in your uterus. ?Checking your cervix to determine: ?Whether it is thinning out (effacing). ?Whether it is opening up (dilating). ?What happens during labor and delivery? ?Normal labor and delivery is divided into the following three stages: ?Stage 1 ?This is the longest stage of labor. ?Throughout this stage, you will feel contractions. Contractions generally feel mild, infrequent, and irregular at first. They get stronger, more frequent, and more regular as you move through this stage. You may have contractions about every 2-3 minutes. ?This stage ends when your cervix is completely dilated to 4 inches (10 cm) and completely effaced. ?Stage 2 ?This stage starts once your cervix is completely effaced and dilated and lasts until the delivery of your baby. ?This is the stage where you will feel an urge to push your baby out of your vagina. ?You may feel stretching and burning pain, especially when the widest part of your baby's head passes through the vaginal opening (crowning). ?Once your baby is delivered, the umbilical cord will be clamped and   cut. Timing of cutting the cord will depend on your wishes, your baby's health, and your health care provider's practices. ?Your baby will be  placed on your bare chest (skin-to-skin contact) in an upright position and covered with a warm blanket. If you are choosing to breastfeed, watch your baby for feeding cues, like rooting or sucking, and help the baby to your breast for his or her first feeding. ?Stage 3 ?This stage starts immediately after the birth of your baby and ends after you deliver the placenta. ?This stage may take anywhere from 5 to 30 minutes. ?After your baby has been delivered, you will feel contractions as your body expels the placenta. These contractions also help your uterus get smaller and reduce bleeding. ?What can I expect after labor and delivery? ?After labor is over, you and your baby will be assessed closely until you are ready to go home. Your health care team will teach you how to care for yourself and your baby. ?You and your baby may be encouraged to stay in the same room (rooming in) during your hospital stay. This will help promote early bonding and successful breastfeeding. ?Your uterus will be checked and massaged regularly (fundal massage). ?You may continue to receive fluids and medicines through an IV. ?You will have some soreness and pain in your abdomen, vagina, and the area of skin between your vaginal opening and your anus (perineum). ?If an incision was made near your vagina (episiotomy) or if you had some vaginal tearing during delivery, cold compresses may be placed on your episiotomy or your tear. This helps to reduce pain and swelling. ?It is normal to have vaginal bleeding after delivery. Wear a sanitary pad for vaginal bleeding and discharge. ?Summary ?Vaginal delivery means that you will give birth by pushing your baby out of your birth canal (vagina). ?Your health care team will monitor you and your baby throughout the stages of labor. ?After you deliver your baby, your health care team will continue to assess you and your baby to ensure you are both recovering as expected after delivery. ?This  information is not intended to replace advice given to you by your health care provider. Make sure you discuss any questions you have with your health care provider. ?Document Revised: 06/14/2020 Document Reviewed: 06/14/2020 ?Elsevier Patient Education ? 2022 [employer] Inc[employer] ? ?

## 2021-07-01 ENCOUNTER — Inpatient Hospital Stay (HOSPITAL_COMMUNITY)
Admission: AD | Admit: 2021-07-01 | Discharge: 2021-07-03 | DRG: 805 | Disposition: A | Discharge status: Home / Self Care | Payer: Medicaid Other | Attending: Family Medicine | Admitting: Family Medicine

## 2021-07-01 ENCOUNTER — Inpatient Hospital Stay (HOSPITAL_COMMUNITY): Payer: Medicaid Other

## 2021-07-01 ENCOUNTER — Encounter (HOSPITAL_COMMUNITY): Payer: Self-pay | Admitting: Obstetrics & Gynecology

## 2021-07-01 DIAGNOSIS — O99824 Streptococcus B carrier state complicating childbirth: Secondary | ICD-10-CM | POA: Diagnosis present

## 2021-07-01 DIAGNOSIS — Z148 Genetic carrier of other disease: Secondary | ICD-10-CM

## 2021-07-01 DIAGNOSIS — J45909 Unspecified asthma, uncomplicated: Secondary | ICD-10-CM | POA: Diagnosis present

## 2021-07-01 DIAGNOSIS — O9852 Other viral diseases complicating childbirth: Secondary | ICD-10-CM | POA: Diagnosis present

## 2021-07-01 DIAGNOSIS — Z30017 Encounter for initial prescription of implantable subdermal contraceptive: Secondary | ICD-10-CM | POA: Diagnosis not present

## 2021-07-01 DIAGNOSIS — O9952 Diseases of the respiratory system complicating childbirth: Secondary | ICD-10-CM | POA: Diagnosis present

## 2021-07-01 DIAGNOSIS — B951 Streptococcus, group B, as the cause of diseases classified elsewhere: Secondary | ICD-10-CM | POA: Diagnosis present

## 2021-07-01 DIAGNOSIS — O0993 Supervision of high risk pregnancy, unspecified, third trimester: Secondary | ICD-10-CM

## 2021-07-01 DIAGNOSIS — U071 COVID-19: Secondary | ICD-10-CM | POA: Diagnosis present

## 2021-07-01 DIAGNOSIS — O24420 Gestational diabetes mellitus in childbirth, diet controlled: Principal | ICD-10-CM | POA: Diagnosis present

## 2021-07-01 DIAGNOSIS — Z3A39 39 weeks gestation of pregnancy: Secondary | ICD-10-CM

## 2021-07-01 DIAGNOSIS — O24410 Gestational diabetes mellitus in pregnancy, diet controlled: Secondary | ICD-10-CM | POA: Diagnosis present

## 2021-07-01 DIAGNOSIS — O99820 Streptococcus B carrier state complicating pregnancy: Secondary | ICD-10-CM

## 2021-07-01 LAB — CBC
HCT: 32.5 % — ABNORMAL LOW (ref 36.0–46.0)
Hemoglobin: 11 g/dL — ABNORMAL LOW (ref 12.0–15.0)
MCH: 30.2 pg (ref 26.0–34.0)
MCHC: 33.8 g/dL (ref 30.0–36.0)
MCV: 89.3 fL (ref 80.0–100.0)
Platelets: 198 10*3/uL (ref 150–400)
RBC: 3.64 MIL/uL — ABNORMAL LOW (ref 3.87–5.11)
RDW: 12.1 % (ref 11.5–15.5)
WBC: 4.8 10*3/uL (ref 4.0–10.5)
nRBC: 0 % (ref 0.0–0.2)

## 2021-07-01 LAB — GLUCOSE, CAPILLARY
Glucose-Capillary: 100 mg/dL — ABNORMAL HIGH (ref 70–99)
Glucose-Capillary: 87 mg/dL (ref 70–99)
Glucose-Capillary: 89 mg/dL (ref 70–99)

## 2021-07-01 LAB — RESP PANEL BY RT-PCR (FLU A&B, COVID) ARPGX2
Influenza A by PCR: NEGATIVE
Influenza B by PCR: NEGATIVE
SARS Coronavirus 2 by RT PCR: POSITIVE — AB

## 2021-07-01 LAB — TYPE AND SCREEN
ABO/RH(D): O POS
Antibody Screen: NEGATIVE

## 2021-07-01 LAB — RPR: RPR Ser Ql: NONREACTIVE

## 2021-07-01 MED ORDER — ONDANSETRON HCL 4 MG/2ML IJ SOLN: 4.0000 mg | Freq: Four times a day (QID) | INTRAMUSCULAR | Status: DC | PRN

## 2021-07-01 MED ORDER — WITCH HAZEL-GLYCERIN EX PADS: 1.0000 "application " | MEDICATED_PAD | CUTANEOUS | Status: DC | PRN

## 2021-07-01 MED ORDER — HYDROXYZINE HCL 50 MG PO TABS: 50.0000 mg | ORAL_TABLET | Freq: Four times a day (QID) | ORAL | Status: DC | PRN

## 2021-07-01 MED ORDER — OXYTOCIN BOLUS FROM INFUSION
333.0000 mL | Freq: Once | INTRAVENOUS | Status: DC
  Administered 2021-07-01: 333 mL via INTRAVENOUS

## 2021-07-01 MED ORDER — IBUPROFEN 600 MG PO TABS
600.0000 mg | ORAL_TABLET | Freq: Four times a day (QID) | ORAL | Status: DC
  Administered 2021-07-02 – 2021-07-03 (×6): 600 mg via ORAL
  Filled 2021-07-01 (×8): qty 1

## 2021-07-01 MED ORDER — DIPHENHYDRAMINE HCL 25 MG PO CAPS: 25.0000 mg | ORAL_CAPSULE | Freq: Four times a day (QID) | ORAL | Status: DC | PRN

## 2021-07-01 MED ORDER — OXYTOCIN-SODIUM CHLORIDE 30-0.9 UT/500ML-% IV SOLN: 2.5000 [IU]/h | INTRAVENOUS | Status: DC

## 2021-07-01 MED ORDER — SIMETHICONE 80 MG PO CHEW: 80.0000 mg | CHEWABLE_TABLET | ORAL | Status: DC | PRN

## 2021-07-01 MED ORDER — ACETAMINOPHEN 325 MG PO TABS: 650.0000 mg | ORAL_TABLET | ORAL | Status: DC | PRN

## 2021-07-01 MED ORDER — BENZOCAINE-MENTHOL 20-0.5 % EX AERO: 1.0000 "application " | INHALATION_SPRAY | CUTANEOUS | Status: DC | PRN

## 2021-07-01 MED ORDER — MISOPROSTOL 25 MCG QUARTER TABLET
25.0000 ug | ORAL_TABLET | ORAL | Status: DC | PRN
  Administered 2021-07-01: 25 ug via VAGINAL
  Filled 2021-07-01: qty 1

## 2021-07-01 MED ORDER — DIBUCAINE (PERIANAL) 1 % EX OINT: 1.0000 "application " | TOPICAL_OINTMENT | CUTANEOUS | Status: DC | PRN

## 2021-07-01 MED ORDER — ZOLPIDEM TARTRATE 5 MG PO TABS: 5.0000 mg | ORAL_TABLET | Freq: Every evening | ORAL | Status: DC | PRN

## 2021-07-01 MED ORDER — TETANUS-DIPHTH-ACELL PERTUSSIS 5-2.5-18.5 LF-MCG/0.5 IM SUSY: 0.5000 mL | PREFILLED_SYRINGE | Freq: Once | INTRAMUSCULAR | Status: DC

## 2021-07-01 MED ORDER — OXYCODONE-ACETAMINOPHEN 5-325 MG PO TABS: 2.0000 | ORAL_TABLET | ORAL | Status: DC | PRN

## 2021-07-01 MED ORDER — LEVONORGESTREL 20.1 MCG/DAY IU IUD
INTRAUTERINE_SYSTEM | INTRAUTERINE | Status: AC
  Filled 2021-07-01: qty 1

## 2021-07-01 MED ORDER — SENNOSIDES-DOCUSATE SODIUM 8.6-50 MG PO TABS
2.0000 | ORAL_TABLET | Freq: Every day | ORAL | Status: DC
  Administered 2021-07-02: 2 via ORAL
  Filled 2021-07-01 (×2): qty 2

## 2021-07-01 MED ORDER — PRENATAL MULTIVITAMIN CH
1.0000 | ORAL_TABLET | Freq: Every day | ORAL | Status: DC
  Administered 2021-07-02 – 2021-07-03 (×2): 1 via ORAL
  Filled 2021-07-01 (×2): qty 1

## 2021-07-01 MED ORDER — ONDANSETRON HCL 4 MG PO TABS: 4.0000 mg | ORAL_TABLET | ORAL | Status: DC | PRN

## 2021-07-01 MED ORDER — PENICILLIN G POT IN DEXTROSE 60000 UNIT/ML IV SOLN
3.0000 10*6.[IU] | INTRAVENOUS | Status: DC
  Administered 2021-07-01 (×2): 3 10*6.[IU] via INTRAVENOUS
  Filled 2021-07-01 (×2): qty 50

## 2021-07-01 MED ORDER — LACTATED RINGERS IV SOLN: INTRAVENOUS | Status: DC

## 2021-07-01 MED ORDER — TERBUTALINE SULFATE 1 MG/ML IJ SOLN: 0.2500 mg | Freq: Once | INTRAMUSCULAR | Status: DC | PRN

## 2021-07-01 MED ORDER — OXYCODONE HCL 5 MG PO TABS: 5.0000 mg | ORAL_TABLET | ORAL | Status: DC | PRN

## 2021-07-01 MED ORDER — SOD CITRATE-CITRIC ACID 500-334 MG/5ML PO SOLN: 30.0000 mL | ORAL | Status: DC | PRN

## 2021-07-01 MED ORDER — ONDANSETRON HCL 4 MG/2ML IJ SOLN: 4.0000 mg | INTRAMUSCULAR | Status: DC | PRN

## 2021-07-01 MED ORDER — SODIUM CHLORIDE 0.9 % IV SOLN
5.0000 10*6.[IU] | Freq: Once | INTRAVENOUS | Status: AC
  Administered 2021-07-01: 5 10*6.[IU] via INTRAVENOUS
  Filled 2021-07-01: qty 5

## 2021-07-01 MED ORDER — LIDOCAINE HCL (PF) 1 % IJ SOLN: 30.0000 mL | INTRAMUSCULAR | Status: DC | PRN

## 2021-07-01 MED ORDER — LACTATED RINGERS IV SOLN
500.0000 mL | INTRAVENOUS | Status: DC | PRN
  Administered 2021-07-01: 1000 mL via INTRAVENOUS

## 2021-07-01 MED ORDER — OXYTOCIN-SODIUM CHLORIDE 30-0.9 UT/500ML-% IV SOLN
1.0000 m[IU]/min | INTRAVENOUS | Status: DC
  Administered 2021-07-01: 2 m[IU]/min via INTRAVENOUS
  Filled 2021-07-01: qty 500

## 2021-07-01 MED ORDER — FENTANYL CITRATE (PF) 100 MCG/2ML IJ SOLN: 50.0000 ug | INTRAMUSCULAR | Status: DC | PRN

## 2021-07-01 MED ORDER — COCONUT OIL OIL
1.0000 "application " | TOPICAL_OIL | Status: DC | PRN
  Administered 2021-07-02: 1 via TOPICAL

## 2021-07-01 MED ORDER — OXYCODONE-ACETAMINOPHEN 5-325 MG PO TABS: 1.0000 | ORAL_TABLET | ORAL | Status: DC | PRN

## 2021-07-01 MED ORDER — FLEET ENEMA 7-19 GM/118ML RE ENEM: 1.0000 | ENEMA | Freq: Every day | RECTAL | Status: DC | PRN

## 2021-07-01 NOTE — Progress Notes (Signed)
Ctx are moderate per pt.  FHR Cat 1.  Cx 6/80/-2.  Ctx somewhat irregular, will augment w/pitocin. Pt wants to go natural, so will delay AROM until more active per request.  + Covid, coughing a little.

## 2021-07-01 NOTE — H&P (Signed)
Kathryn Silva is a 25 y.o. female G3P1011 with IUP at [redacted]w[redacted]d presenting for IOL for A1DM. [facility].  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 No PNC. Came into OB triage VB, pain and BBOW. See surg path for fetus (+abnormalities). Needed D&C for rPOCs. Negative KB, TSH, rpr, hiv, cbc, GC/CT, wet prep, U/a, UDS.    Supervision of other normal pregnancy, antepartum 03/19/2017    Clinic [facility] Prenatal Labs Dating  2nd trimester [place] Blood type: O/Positive/-- (08/20 1354)  Genetic Screen 1 Screen: too late      Quad: declines     Antibody:Negative (08/20 1354) negative Anatomic [place]  Normal - female Rubella: 6.07 (08/20 1354)immune GTT Early:  N/A             Third trimester:  RPR: Non Reactive (08/20 1354)  Flu vaccine  Declined HBsAg: Negative (08/20 1354) negative TDaP vacc    Past Surgical History: Past Surgical History:  Procedure Laterality Date   DILATION AND EVACUATION N/A 09/22/2016   Procedure: SUCTION DILATATION AND EVACUATION;  Surgeon: Yell Bing, MD;  Location: [facility];  Service: Gynecology;  Laterality: N/A;    Obstetrical History: OB History     Gravida  3   Para  1   Term  1   Preterm  0   AB  1   Living  1      SAB  1   IAB  0   Ectopic  0   Multiple  0   Live Births  1           Social History: Social History   Socioeconomic History   Marital status: Single    Spouse name: Not on file   Number of children: Not on file   Years of education: Not on file   Highest education level: Not on file  Occupational History   Not on file  Tobacco Use   Smoking status: Never   Smokeless tobacco: Never  Vaping Use   Vaping Use: Never used  Substance and Sexual Activity   Alcohol use: No   Drug use: No   Sexual activity: Yes     Birth control/protection: None  Other Topics Concern   Not on file  Social History Narrative   Not on file   Social Determinants of Health   Financial Resource Strain: Not on file  Food Insecurity: No Food Insecurity   Worried About Running Out of Food in the Last Year: Never true   Ran Out of Food in the Last Year: Never true  Transportation Needs: No Transportation Needs   Lack of Transportation (Medical): No   Lack of Transportation (Non-Medical): No  Physical Activity: Not on file  Stress: Not on file  Social Connections: Not on file    Family History: Family History  Problem Relation Age of Onset   Hypertension Mother     Allergies: No Known Allergies  Medications Prior to Admission  Medication Sig Dispense Refill Last Dose   Accu-Chek Softclix Lancets lancets Use as instructed; check blood glucose 4 times each day 100 each 6    albuterol (VENTOLIN HFA) 108 (90 Base) MCG/ACT inhaler Inhale 1-2 puffs into the lungs every 6 (six) hours as needed for wheezing or shortness of breath. 18  g 0    ferrous sulfate (FERROUSUL) 325 (65 FE) MG tablet Take 1 tablet (325 mg total) by mouth every other day. 60 tablet 0    fluticasone (FLONASE) 50 MCG/ACT nasal spray Place 2 sprays into both nostrils daily. 16 g 0    glucose blood (ACCU-CHEK GUIDE) test strip Use as instructed; check blood glucose 4 times each day 100 each 6    Prenatal Vit-Fe Fumarate-FA (PRENATAL COMPLETE) 14-0.4 MG TABS Take 1 tablet by mouth daily. 60 tablet 0         Review of Systems   Constitutional: Negative for fever and chills Eyes: Negative for visual disturbances Respiratory: Negative for shortness of breath, dyspnea Cardiovascular: Negative for chest pain or palpitations  Gastrointestinal: Negative for abdominal pain, vomiting, diarrhea and constipation.   Genitourinary: Negative for dysuria and urgency Musculoskeletal: Negative for back pain, joint pain, myalgias  Neurological: Negative for  dizziness and headaches      Blood pressure 117/87, pulse (!) 113, temperature 98.5 F (36.9 C), temperature source Oral, last menstrual period 09/06/2020. General appearance: alert, cooperative, and no distress Lungs: normal respiratory effort Heart: regular rate and rhythm Abdomen: soft, non-tender; bowel sounds normal Extremities: Homans sign is negative, no sign of DVT DTR's 2+ Presentation: cephalic Fetal monitoring  Baseline: 150 bpm, Variability: Good {> 6 bpm), Accelerations: Reactive, and Decelerations: Absent Uterine activity  None      Prenatal labs: ABO, Rh: O/Positive/-- (05/31 1136) Antibody:  pending Rubella: immune RPR: Non Reactive (09/14 0839)  HBsAg: Negative (05/31 1136)  HIV: Non Reactive (09/14 0839)  GBS: Positive/-- (11/07 1058)   Nursing Staff Provider  Office Location [facility] Dating  16 week Korea  Language  English Anatomy US  Scheduled 7/20 ([place] 6/20 too early)  Flu Vaccine  Declined Genetic/Carrier Screen  NIPS:   Low risk AFP:   Negative Horizon: SMA   TDaP Vaccine   04/13/21 Hgb A1C or  GTT  Third trimester: Abnormal  2 hr with fasting (elevated 92), 120,78  COVID Vaccine Declined   LAB RESULTS   Rhogam  N/A Blood Type O/Positive/-- (05/31 1136)   Baby Feeding Plan Breast Antibody    Contraception PP IUD Rubella <20.0 (05/31 1136) Immune  Circumcision N/A RPR Non Reactive (09/14 0839)   Pediatrician  [facility] HBsAg Negative (05/31 1136)   Support Person Dealer (FOB/Partner) HC VAb Neg  Prenatal Classes NA HIV Non Reactive (09/14 0839)     BTL Consent NA GBS    POS   VBAC Consent NA Pap Normal 12/28/20       BP Cuff Rx sent Waterbirth  [ ]  Class [ ]  Consent [ ]  CNM visit  PHQ9 & GAD7 [x]  new OB [ x ] 28 weeks  [x  ] 36 weeks Induction  [ ]  Orders Entered [ ] Foley Y/N   Prenatal Transfer Tool  Maternal Diabetes: Yes:  Diabetes Type:  Diet controlled Genetic Screening: Normal Maternal Ultrasounds/Referrals: Normal Fetal  Ultrasounds or other Referrals:  None Maternal Substance Abuse:  No Significant Maternal Medications:  None Significant Maternal Lab Results: Group B Strep positive    No results found for this or any previous visit (from the past 24 hour(s)).  Assessment: Kathryn Silva is a 25 y.o. G3P1011 with an IUP at [redacted]w[redacted]d presenting for IOL for A1DM.  Plan: #Labor: Cytotec->pitocin #Pain:  Per request #FWB Cat 1 #ID: GBS: POS     07/01/2021, 12:13 AM

## 2021-07-01 NOTE — Lactation Note (Signed)
This note was copied from a baby's chart. Lactation Consultation Note  Patient Name: Kathryn Silva [id number] Date: 07/01/2021 Reason for consult: L&D Initial assessment;Mother's request;Term;Breastfeeding assistance;Maternal endocrine disorder Age:24 hours  LC assisted with latching with signs of milk transfer.  Mom feeding plan to EBM.  Mom to receive further LC support on the floor.   Maternal Data Does the patient have breastfeeding experience prior to this delivery?: Yes How long did the patient breastfeed?: 1 year  Feeding Mother's Current Feeding Choice: Breast Milk  LATCH Score Latch: Grasps breast easily, tongue down, lips flanged, rhythmical sucking.  Audible Swallowing: Spontaneous and intermittent  Type of Nipple: Everted at rest and after stimulation  Comfort (Breast/Nipple): Soft / non-tender  Hold (Positioning): Assistance needed to correctly position infant at breast and maintain latch.  LATCH Score: 9   Lactation Tools Discussed/Used    Interventions Interventions: Breast feeding basics reviewed;Assisted with latch;Skin to skin;Breast massage;Hand express;Breast compression;Adjust position;Education  Discharge Pump: Personal WIC Program: No  Consult Status Consult Status: Follow-up Date: 07/02/21 Follow-up type: In-patient    Iyanah Demont  Nicholson-Springer 07/01/2021, 1:50 PM

## 2021-07-01 NOTE — Discharge Summary (Signed)
Postpartum Discharge Summary     Patient Name: Kathryn Silva DOB: 04/23/1996 MRN: [id number]  Date of admission: 07/01/2021 Delivery date:07/01/2021  Delivering provider: Serita Grammes D  Date of discharge: 07/03/2021  Admitting diagnosis: Diet controlled gestational diabetes mellitus (GDM) in third trimester [O24.410] Intrauterine pregnancy: [redacted]w[redacted]d    Secondary diagnosis:  Principal Problem:   Vaginal delivery Active Problems:   Carrier of spinal muscular atrophy   Diet controlled gestational diabetes mellitus (GDM) in third trimester   Positive testing for group B Streptococcus  Additional problems: None    Discharge diagnosis: Term Pregnancy Delivered and GDM A1                                              Post partum procedures: Nexplanon placement prior to discharge Augmentation:  Cytotec x 1 (removed shortly after given d/t FHR decels); IP foley; Pit; AROM Complications: None  Hospital course: Induction of Labor With Vaginal Delivery   25y.o. yo G3P1011 at 353w4das admitted to the hospital 07/01/2021 for induction of labor.  Indication for induction: A1 DM.  Patient had an uncomplicated labor course as follows: Membrane Rupture Time/Date: 10:09 AM ,07/01/2021   Delivery Method:Vaginal, Spontaneous  Episiotomy: None  Lacerations:  None  Details of delivery can be found in separate delivery note.  Patient had a routine postpartum course and is meeting all milestones. Patient is discharged home 07/03/21.  Newborn Data: Birth date:07/01/2021  Birth time:1:03 PM  Gender:Female  Living status:Living  Apgars:9 ,9  Weight:3120 g (6lb 14.1oz)  Magnesium Sulfate received: No BMZ received: No Rhophylac: N/A MMR: N/A T-DaP: Given prenatally Flu: No Transfusion: No  Physical exam  Vitals:   07/02/21 0415 07/02/21 1900 07/03/21 0000 07/03/21 0540  BP: 113/70 104/67 101/71 109/73  Pulse: 66 65 73 77  Resp: 16 14 16 16   Temp: 98.5 F (36.9 C) 98.6 F (37 C) 98.6 F  (37 C) 98.4 F (36.9 C)  TempSrc: Oral Oral Oral Oral  SpO2:  99% 100% 99%  Weight:      Height:       General: alert, cooperative, and no distress Lochia: appropriate Uterine Fundus: firm and below umbilicus  DVT Evaluation: no LE edema or calf tenderness to palpation  Labs: Lab Results  Component Value Date   WBC 4.8 07/01/2021   HGB 11.0 (L) 07/01/2021   HCT 32.5 (L) 07/01/2021   MCV 89.3 07/01/2021   PLT 198 07/01/2021   CMP Latest Ref Rng & Units 03/22/2020  Glucose 70 - 99 mg/dL 135(H)  BUN 6 - 20 mg/dL 12  Creatinine 0.44 - 1.00 mg/dL 0.79  Sodium 135 - 145 mmol/L 137  Potassium 3.5 - 5.1 mmol/L 3.8  Chloride 98 - 111 mmol/L 107  CO2 22 - 32 mmol/L 22  Calcium 8.9 - 10.3 mg/dL 9.1  Total Protein 6.5 - 8.1 g/dL -  Total Bilirubin 0.3 - 1.2 mg/dL -  Alkaline Phos 38 - 126 U/L -  AST 15 - 41 U/L -  ALT 14 - 54 U/L -   Edinburgh Score: Edinburgh Postnatal Depression Scale Screening Tool 07/02/2021  I have been able to laugh and see the funny side of things. 0  I have looked forward with enjoyment to things. 0  I have blamed myself unnecessarily when things went wrong. 0  I have  been anxious or worried for no good reason. 0  I have felt scared or panicky for no good reason. 0  Things have been getting on top of me. 0  I have been so unhappy that I have had difficulty sleeping. 0  I have felt sad or miserable. 0  I have been so unhappy that I have been crying. 0  The thought of harming myself has occurred to me. 0  Edinburgh Postnatal Depression Scale Total 0     After visit meds:  Allergies as of 07/03/2021   No Known Allergies      Medication List     STOP taking these medications    Accu-Chek Guide test strip Generic drug: glucose blood   Accu-Chek Softclix Lancets lancets       TAKE these medications    acetaminophen 500 MG tablet Commonly known as: TYLENOL Take 2 tablets (1,000 mg total) by mouth every 8 (eight) hours as needed (pain).    albuterol 108 (90 Base) MCG/ACT inhaler Commonly known as: VENTOLIN HFA Inhale 1-2 puffs into the lungs every 6 (six) hours as needed for wheezing or shortness of breath.   ferrous sulfate 325 (65 FE) MG tablet Commonly known as: FerrouSul Take 1 tablet (325 mg total) by mouth every other day.   fluticasone 50 MCG/ACT nasal spray Commonly known as: FLONASE Place 2 sprays into both nostrils daily.   ibuprofen 600 MG tablet Commonly known as: ADVIL Take 1 tablet (600 mg total) by mouth every 6 (six) hours as needed (pain).   Prenatal Complete 14-0.4 MG Tabs Take 1 tablet by mouth daily.         Discharge home in stable condition Infant Feeding: Bottle and Breast Infant Disposition: Possibly rooming in pending bilirubin levels Discharge instruction: per After Visit Summary and Postpartum booklet. Activity: Advance as tolerated. Pelvic rest for 6 weeks.  Diet: routine diet Future Appointments: Future Appointments  Date Time Provider [place]  07/29/2021  8:15 AM Virginia Rochester, NP [facility]  07/29/2021  8:50 AM [facility] [place]   Follow up Visit:  Myrtis Ser, CNM  P [facility] Admin Pool Please schedule this patient for Postpartum visit in: 6 weeks with the following provider: Any provider  In-Person  For C/S patients schedule nurse incision check in weeks 2 weeks: No  High risk pregnancy complicated by: [email or web address]  Delivery mode:  SVD  Anticipated Birth Control: Nexplanon PP Procedures needed: 2hr GTT  Schedule Integrated [place] visit: No   07/03/2021 Genia Del, MD

## 2021-07-01 NOTE — Progress Notes (Signed)
FHR 150's, moderate variability, + accels. Had a run of several late decels, responded to IVF bolus and position change.  Cytotec was inserted about an hour ago, ctx q 1.5-2 minutes, mild.  Minimally dissolved tablet removed from vagina.  Cooks catheter inserted and inflated w/80cc H20.  FHR Cat 1 now, although tachysystolic uterine ctx.  If any more decels, will give terbutaline.

## 2021-07-01 NOTE — Progress Notes (Signed)
Patient ID: Kathryn Silva, female   DOB: 1995-10-08, 25 y.o.   MRN: [id number]  Feeling a little uncomfortable; Pit started at 0615; requesting AROM; no longer wants IUD placed postplacentally  BP 124/75, P 101 FHR 130s, +accels, no decels, Cat 1 Ctx q 2-4 mins with Pit at 32mu/min Cx 6+/70/vtx -2; AROM for clear fluid  CBG 87  IUP@39 .4wks IOL process [email or web address]  Hope that with rupture and Pit that she will progress to active labor  Anticipate vag del  Arabella Merles CNM 07/01/2021 10:14 AM

## 2021-07-02 NOTE — Progress Notes (Signed)
Post Partum Day #1 Subjective: no complaints, up ad lib, and tolerating PO; breastfeeding going well; considering either an IUD or a Nexplanon for contraception; baby under light therapy; remains mostly asymptomatic from Covid  Objective: Blood pressure 113/70, pulse 66, temperature 98.5 F (36.9 C), temperature source Oral, resp. rate 16, height 4\' 11"  (1.499 m), weight 69.9 kg, last menstrual period 09/06/2020, unknown if currently breastfeeding.  Physical Exam:  General: alert, cooperative, and no distress Lochia: appropriate Uterine Fundus: firm DVT Evaluation: No evidence of DVT seen on physical exam.  Recent Labs    07/01/21 0014  HGB 11.0*  HCT 32.5*    Assessment/Plan: Plan for discharge tomorrow Will let her nurse know if she desires a Nexplanon placement prior to d/c   LOS: 1 day   14/02/22 CNM 07/02/2021, 9:28 AM

## 2021-07-03 DIAGNOSIS — Z30017 Encounter for initial prescription of implantable subdermal contraceptive: Secondary | ICD-10-CM

## 2021-07-03 MED ORDER — IBUPROFEN 600 MG PO TABS
600.0000 mg | ORAL_TABLET | Freq: Four times a day (QID) | ORAL | 0 refills | Status: DC | PRN
Start: 2021-07-03 — End: 2023-03-14

## 2021-07-03 MED ORDER — ETONOGESTREL 68 MG ~~LOC~~ IMPL
68.0000 mg | DRUG_IMPLANT | Freq: Once | SUBCUTANEOUS | Status: AC
  Administered 2021-07-03: 68 mg via SUBCUTANEOUS
  Filled 2021-07-03: qty 1

## 2021-07-03 MED ORDER — ACETAMINOPHEN 500 MG PO TABS: 1000.0000 mg | ORAL_TABLET | Freq: Three times a day (TID) | ORAL | 0 refills | Status: DC | PRN

## 2021-07-03 MED ORDER — LIDOCAINE HCL 1 % IJ SOLN
0.0000 mL | Freq: Once | INTRAMUSCULAR | Status: AC | PRN
  Administered 2021-07-03: 12:00:00 2 mL via INTRADERMAL
  Filled 2021-07-03: qty 20

## 2021-07-03 NOTE — Lactation Note (Signed)
This note was copied from a baby's chart. Lactation Consultation Note  Patient Name: Girl Jamiya Nims [id number] Date: 07/03/2021 Reason for consult: Follow-up assessment;Term;Hyperbilirubinemia;Maternal endocrine disorder Age:24 hours  LC in to visit with P2 Mom of term baby at 2% weight loss.  Baby under double phototherapy with +DAT jaundice.  Plan is to check another level this evening, stop phototherapy if stable or decreased and recheck in the am.  Mom pumping using 27 mm flanges.  Mom's last pump she expressed 90 ml which she fed to baby just recently.  Baby sleeping beside Mom on bili blanket.    Mom denies nipple soreness with pumping. RN gave Mom coconut oil when pump set up yesterday. Mom using maintenance setting on pump.   Mom denies questions and encouraged to call prn   Lactation Tools Discussed/Used Tools: Pump;Flanges;Coconut oil;Bottle Flange Size: 27 Breast pump type: Double-Electric Breast Pump Pump Education: Setup, frequency, and cleaning Reason for Pumping: Support milk supply/hyperbilirubinemia/Mom supplementing pc Pumping frequency: Q 3hrs Pumped volume: 90 mL  Interventions Interventions: Breast feeding basics reviewed;DEBP;Coconut oil  Consult Status Consult Status: Follow-up Date: 07/04/21 Follow-up type: In-patient    Judee Clara 07/03/2021, 2:13 PM

## 2021-07-03 NOTE — Progress Notes (Signed)
Patient ID: Kathryn Silva, female   DOB: [birth year], 25 y.o.   MRN: [id number]      GYNECOLOGY OFFICE PROCEDURE NOTE  Kathryn Silva is a 25 y.o. [id number] here for Nexplanon insertion.    Nexplanon Insertion Procedure Patient identified, informed consent performed, consent signed.   Patient does understand that irregular bleeding is a very common side effect of this medication. She was advised to have backup contraception for one week after placement. Pregnancy test in clinic today was negative.  Appropriate time out taken.  Patient's left arm was prepped and draped in the usual sterile fashion. The ruler used to measure and mark insertion area.  Patient was prepped with alcohol swab and then injected with 3 ml of 1% lidocaine.  She was prepped with betadine, Nexplanon removed from packaging,  Device confirmed in needle, then inserted full length of needle and withdrawn per handbook instructions. Nexplanon was able to palpated in the patient's arm; patient palpated the insert herself. There was minimal blood loss.  Patient insertion site covered with guaze and a pressure bandage to reduce any bruising.  The patient tolerated the procedure well and was given post procedure instructions.    Rhayne Chatwin, Harolyn Rutherford, NP [facility] [employer], [facility]

## 2021-07-15 ENCOUNTER — Telehealth (HOSPITAL_COMMUNITY): Payer: Self-pay | Admitting: *Deleted

## 2021-07-15 NOTE — Telephone Encounter (Signed)
Phone voicemail message left to return nurse call.  Duffy Rhody, RN 07-15-2021 at 12:14pm

## 2021-07-29 ENCOUNTER — Other Ambulatory Visit: Payer: Self-pay

## 2021-07-29 ENCOUNTER — Ambulatory Visit (INDEPENDENT_AMBULATORY_CARE_PROVIDER_SITE_OTHER): Payer: Medicaid Other | Admitting: Nurse Practitioner

## 2021-07-29 ENCOUNTER — Other Ambulatory Visit: Payer: Medicaid Other

## 2021-07-29 VITALS — BP 121/75 | HR 72 | Wt 139.7 lb

## 2021-07-29 DIAGNOSIS — O24410 Gestational diabetes mellitus in pregnancy, diet controlled: Secondary | ICD-10-CM

## 2021-07-29 NOTE — Progress Notes (Signed)
Post Partum Visit Note  Kathryn Silva is a 25 y.o. G32P2012 female who presents for a postpartum visit. She is 4 weeks postpartum following a normal spontaneous vaginal delivery.  I have fully reviewed the prenatal and intrapartum course. The delivery was IOL at 39 weeks and 4 days gestational weeks for gestational diabetes.  Anesthesia: none. Postpartum course has been going well per patient. Baby is doing well. Baby is feeding by breast. Bleeding staining only. Bowel function is normal. Bladder function is normal. Patient is not sexually active. Contraception method is Nexplanon. Postpartum depression screening: negative.     Edinburgh Postnatal Depression Scale - 07/29/21 0901       Edinburgh Postnatal Depression Scale:  In the Past 7 Days   I have been able to laugh and see the funny side of things. 0    I have looked forward with enjoyment to things. 0    I have blamed myself unnecessarily when things went wrong. 0    I have been anxious or worried for no good reason. 0    I have felt scared or panicky for no good reason. 0    Things have been getting on top of me. 0    I have been so unhappy that I have had difficulty sleeping. 0    I have felt sad or miserable. 0    I have been so unhappy that I have been crying. 0    The thought of harming myself has occurred to me. 0    Edinburgh Postnatal Depression Scale Total 0             Health Maintenance Due  Topic Date Due   Pneumococcal Vaccine 3-53 Years old (1 - PCV) Never done   URINE MICROALBUMIN  Never done   HPV VACCINES (1 - 2-dose series) Never done    The following portions of the patient's history were reviewed and updated as appropriate: allergies, current medications, past family history, past medical history, past social history, past surgical history, and problem list.  Review of Systems Pertinent items noted in HPI and remainder of comprehensive ROS otherwise negative.  Objective:  BP 121/75    Pulse 72     Wt 139 lb 11.2 oz (63.4 kg)    LMP 09/06/2020 (Within Weeks)    Breastfeeding Yes    BMI 28.22 kg/m    General:  alert, cooperative, and no distress   Breasts:  not indicated  Lungs: clear to auscultation bilaterally  Heart:  regular rate and rhythm, S1, S2 normal, no murmur, click, rub or gallop  Abdomen: Not indicated    Wound NA  GU exam:  not indicated       Assessment:    There are no diagnoses linked to this encounter.  Normal postpartum exam.   Plan:   Essential components of care per ACOG recommendations:  1.  Mood and well being: Patient with negative depression screening today. Reviewed local resources for support.  - Patient tobacco use? No.   - hx of drug use? No.    2. Infant care and feeding:  -Patient currently breastmilk feeding? Yes -Social determinants of health (SDOH) reviewed in EPIC. No concerns  3. Sexuality, contraception and birth spacing.  Nexplanon palapted in left inner upper arm. Discussed birth spacing of 18 months  4. Sleep and fatigue -Encouraged family/partner/community support of 4 hrs of uninterrupted sleep to help with mood and fatigue  5. Physical Recovery  - Discussed patients  delivery and complications. She describes her labor as good. - Patient had a Vaginal, no problems at delivery. Patient had  No  laceration. Perineal healing reviewed. Patient expressed understanding - Patient has urinary incontinence? No. - Patient is safe to resume physical and sexual activity  6.  Health Maintenance - HM due items addressed Yes - Last pap smear  Diagnosis  Date Value Ref Range Status  12/28/2020   Final   - Negative for intraepithelial lesion or malignancy (NILM)   Pap smear not done at today's visit.  -Breast Cancer screening indicated? No.   7. Chronic Disease/Pregnancy Condition follow up: None Glucola labs pending and will arrange follow up if needed.  - PCP follow up  Currie Paris, NP Center for [employer], [facility]

## 2021-07-30 ENCOUNTER — Encounter: Payer: Self-pay | Admitting: Nurse Practitioner

## 2021-08-05 ENCOUNTER — Other Ambulatory Visit: Payer: Medicaid Other

## 2021-08-05 ENCOUNTER — Other Ambulatory Visit: Payer: Self-pay

## 2021-08-05 DIAGNOSIS — O24410 Gestational diabetes mellitus in pregnancy, diet controlled: Secondary | ICD-10-CM

## 2021-08-06 LAB — GLUCOSE TOLERANCE, 2 HOURS
Glucose, 2 hour: 79 mg/dL (ref 70–139)
Glucose, GTT - Fasting: 83 mg/dL (ref 70–99)

## 2022-03-01 IMAGING — US US PELVIS COMPLETE TRANSABD/TRANSVAG W DUPLEX
2 series · 13 of 25 positions shown · non-contrast
Comparison: None

CLINICAL DATA: Abnormal uterine bleeding, abdominal and pelvic
cramping, LMP 03/05/2020, negative pregnancy test today

EXAM:
TRANSABDOMINAL AND TRANSVAGINAL ULTRASOUND OF PELVIS
DOPPLER ULTRASOUND OF OVARIES
TECHNIQUE: Both transabdominal and transvaginal ultrasound examinations of the
pelvis were performed. Transabdominal technique was performed for
global imaging of the pelvis including uterus, ovaries, adnexal
regions, and pelvic cul-de-sac.
It was necessary to proceed with endovaginal exam following the
transabdominal exam to visualize the endometrium. Color and duplex
Doppler ultrasound was utilized to evaluate blood flow to the
ovaries.

[Series 1: us pelvis complete transabd/transvag w duplex · 12 of 37 slices shown (1 of 2)]
[im 1/37]
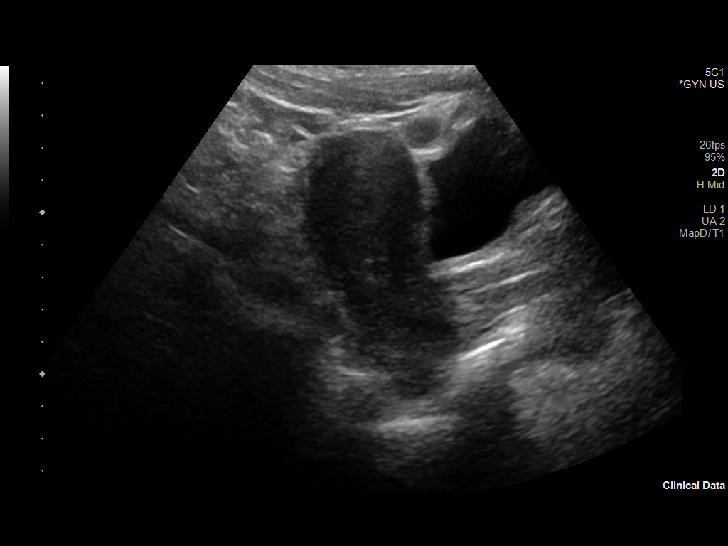
[im 4/37]
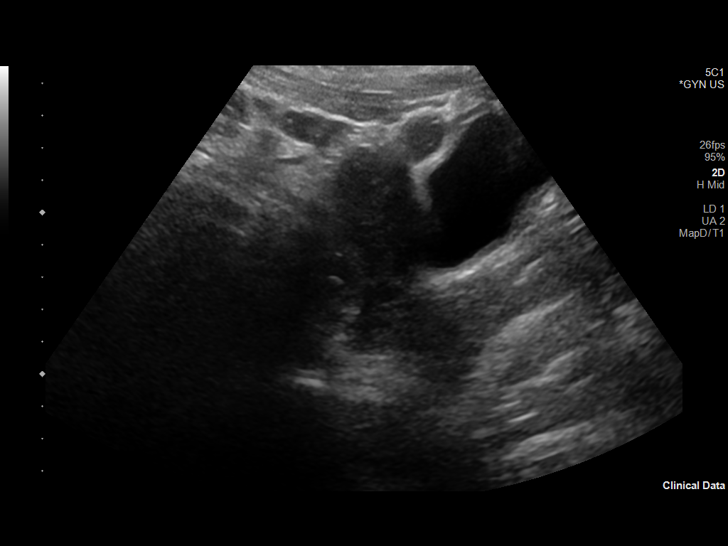
[im 7/37]
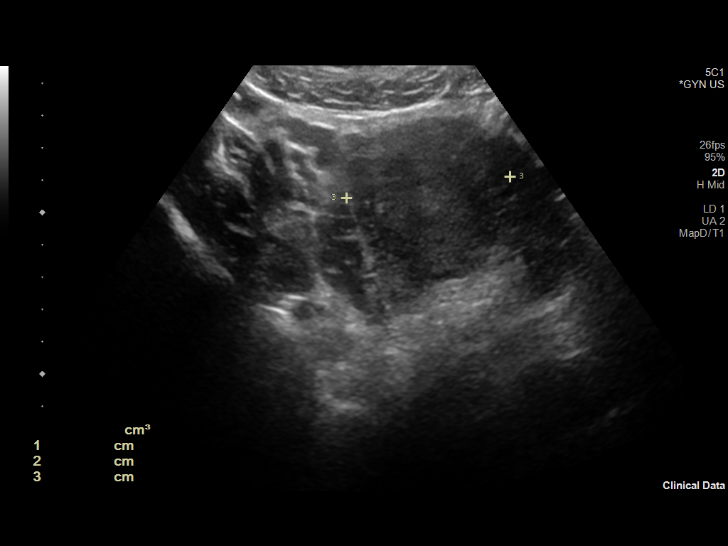
[im 10/37]
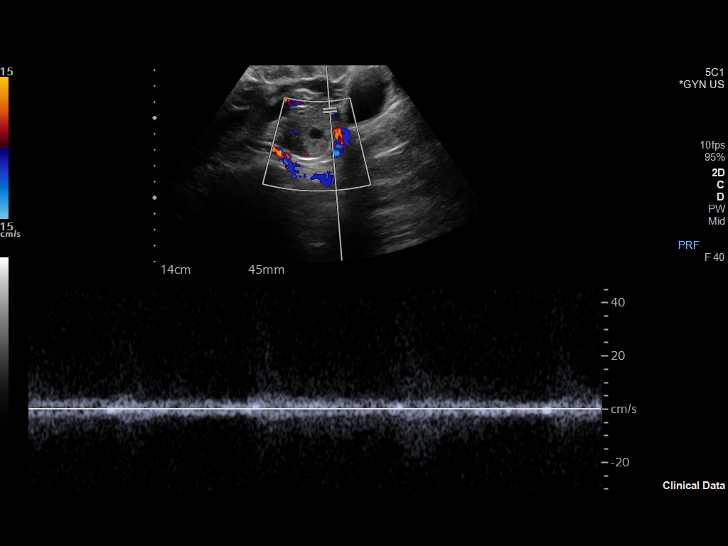
[im 13/37]
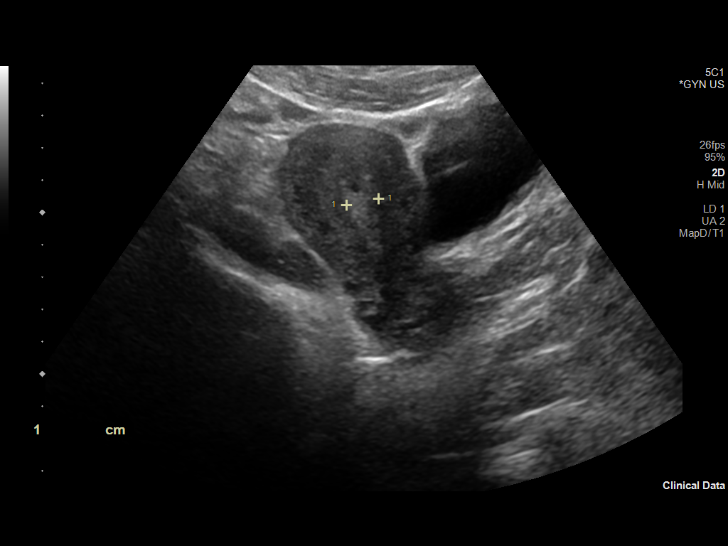
[im 16/37]
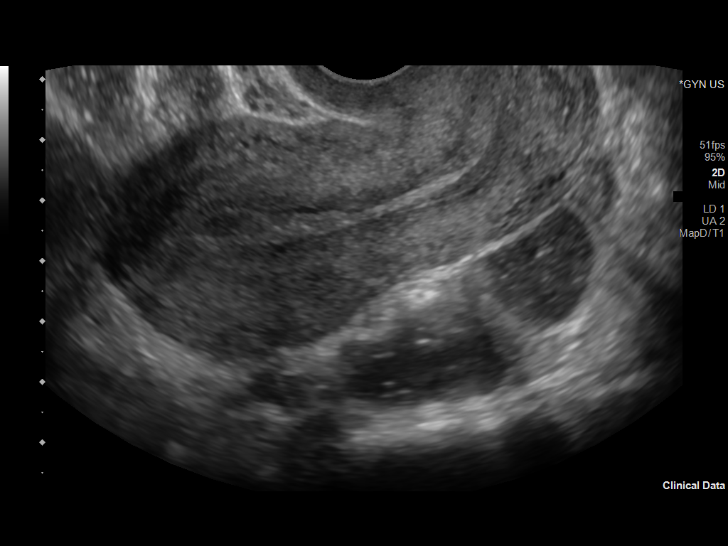
[im 19/37]
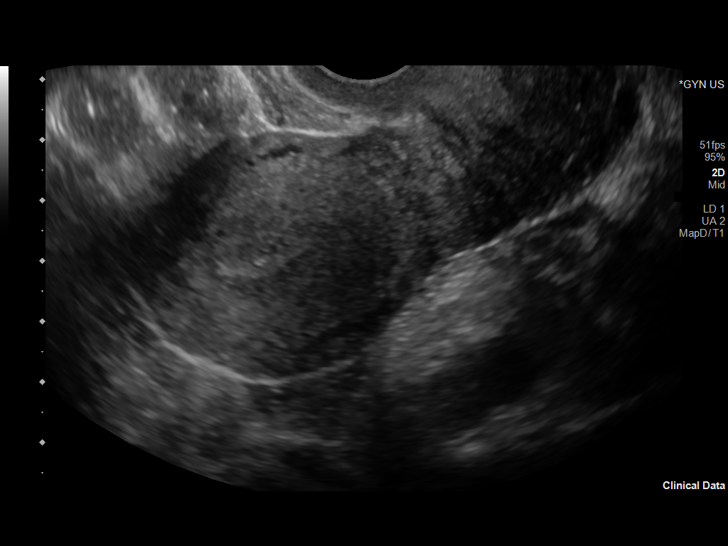
[im 22/37]
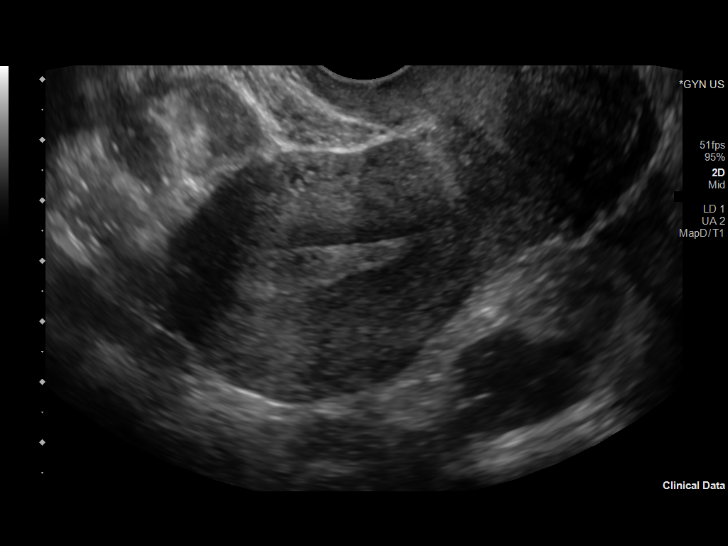
[im 26/37]
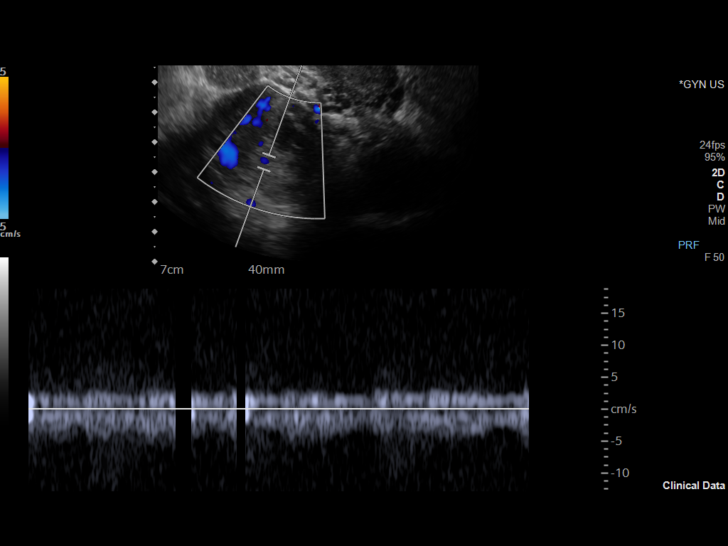
[im 29/37]
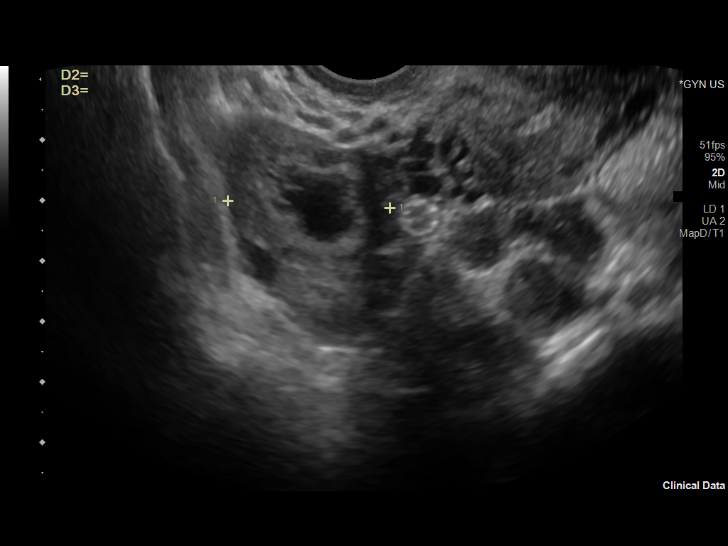
[im 32/37]
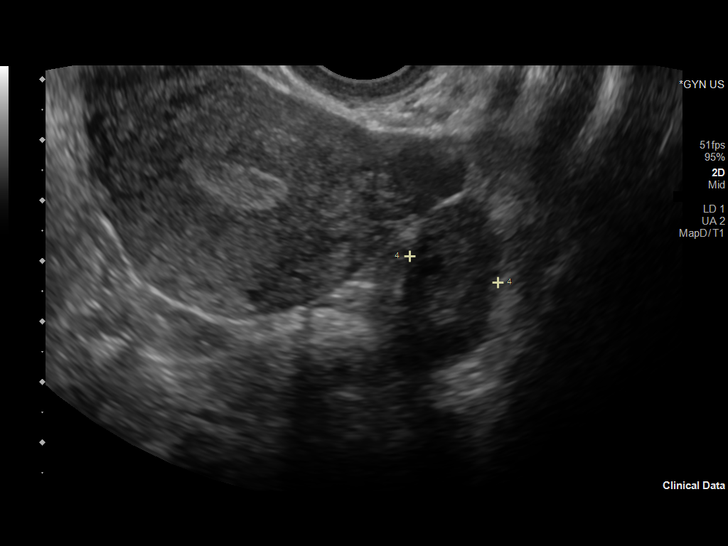
[im 35/37]
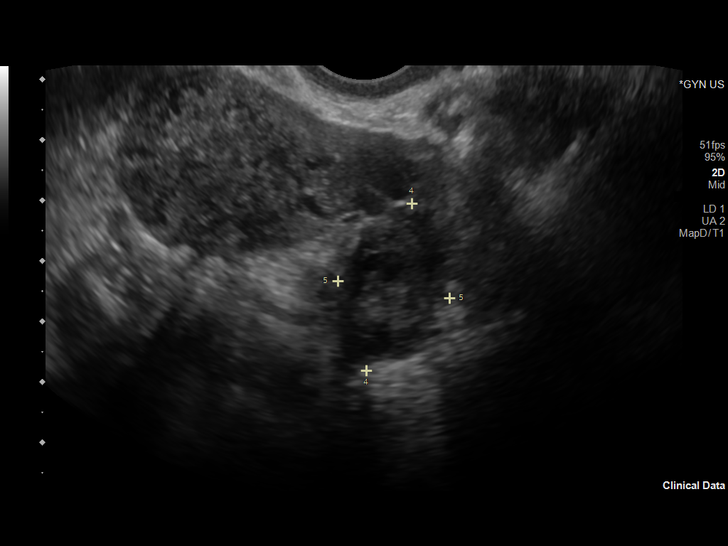

[Series 2: us pelvis complete transabd/transvag w duplex · 1 of 2 slices shown (2 of 2)]
[im 1/2]
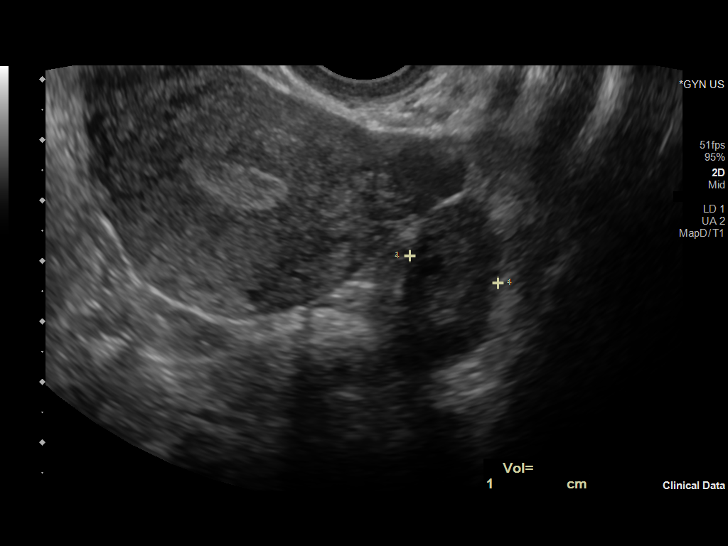

[13 of 25 positions shown; findings below may reference images not displayed]

FINDINGS: Uterus

Measurements: 8.3 x 4.0 x 4.7 cm = volume: 82 mL. Anteverted. Normal
morphology without mass

Endometrium

Thickness: 10 mm.  No endometrial fluid or focal abnormality

Right ovary

Measurements: 4.0 x 2.8 x 2.7 cm = volume: 15.7 mL. Small resolving
corpus luteum. No additional mass. Blood flow present within RIGHT
ovary on color Doppler imaging.

Left ovary

Measurements: 2.9 x 1.8 x 1.5 cm = volume: 4.2 mL. Normal morphology
without mass. Blood flow present within LEFT ovary on color Doppler
imaging.

Pulsed Doppler evaluation of both ovaries demonstrates normal
low-resistance arterial and venous waveforms.

Other findings

No free pelvic fluid.  No adnexal masses.
IMPRESSION: Normal exam.

## 2022-08-26 ENCOUNTER — Other Ambulatory Visit: Payer: Self-pay

## 2022-08-26 ENCOUNTER — Emergency Department (HOSPITAL_COMMUNITY)
Admission: EM | Admit: 2022-08-26 | Discharge: 2022-08-27 | Disposition: A | Discharge status: Home / Self Care | Payer: Medicaid Other | Attending: Student | Admitting: Student

## 2022-08-26 ENCOUNTER — Emergency Department (HOSPITAL_BASED_OUTPATIENT_CLINIC_OR_DEPARTMENT_OTHER)
Admission: EM | Admit: 2022-08-26 | Discharge: 2022-08-26 | Discharge status: Against Medical Advice | Payer: Worker's Compensation | Source: Home / Self Care

## 2022-08-26 DIAGNOSIS — Y990 Civilian activity done for income or pay: Secondary | ICD-10-CM | POA: Diagnosis not present

## 2022-08-26 DIAGNOSIS — J45909 Unspecified asthma, uncomplicated: Secondary | ICD-10-CM | POA: Diagnosis not present

## 2022-08-26 DIAGNOSIS — M545 Low back pain, unspecified: Secondary | ICD-10-CM | POA: Diagnosis not present

## 2022-08-26 DIAGNOSIS — Y92410 Unspecified street and highway as the place of occurrence of the external cause: Secondary | ICD-10-CM | POA: Diagnosis not present

## 2022-08-26 DIAGNOSIS — M25552 Pain in left hip: Secondary | ICD-10-CM | POA: Insufficient documentation

## 2022-08-26 DIAGNOSIS — Z7951 Long term (current) use of inhaled steroids: Secondary | ICD-10-CM | POA: Diagnosis not present

## 2022-08-26 HISTORY — DX: Heart failure, unspecified: I50.9
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
## 2022-08-27 ENCOUNTER — Emergency Department (HOSPITAL_COMMUNITY): Payer: Medicaid Other

## 2022-08-27 ENCOUNTER — Encounter (HOSPITAL_COMMUNITY): Payer: Self-pay

## 2022-08-27 MED ORDER — NAPROXEN 250 MG PO TABS
500.0000 mg | ORAL_TABLET | Freq: Once | ORAL | Status: AC
  Administered 2022-08-27: 500 mg via ORAL
  Filled 2022-08-27: qty 2

## 2022-08-27 MED ORDER — NAPROXEN 500 MG PO TABS: 500.0000 mg | ORAL_TABLET | Freq: Two times a day (BID) | ORAL | 0 refills | Status: DC

## 2022-08-27 MED ORDER — HYDROCODONE-ACETAMINOPHEN 5-325 MG PO TABS
1.0000 | ORAL_TABLET | Freq: Once | ORAL | Status: AC
  Administered 2022-08-27: 1 via ORAL
  Filled 2022-08-27: qty 1
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
## 2022-08-29 ENCOUNTER — Ambulatory Visit (HOSPITAL_COMMUNITY)
Admission: EM | Admit: 2022-08-29 | Discharge: 2022-08-29 | Discharge status: Against Medical Advice | Payer: Medicaid Other | Attending: Family Medicine | Admitting: Family Medicine

## 2022-08-29 NOTE — ED Notes (Signed)
Called pt from lobby x 2.

## 2022-08-29 NOTE — ED Notes (Signed)
Patient's name called x 1. No answer.

## 2023-03-14 ENCOUNTER — Encounter: Payer: Self-pay | Admitting: Primary Care

## 2023-03-14 ENCOUNTER — Ambulatory Visit (INDEPENDENT_AMBULATORY_CARE_PROVIDER_SITE_OTHER): Payer: Self-pay | Admitting: Primary Care

## 2023-03-14 VITALS — BP 128/82 | HR 98 | Temp 98.1°F | Ht 59.06 in | Wt 159.2 lb

## 2023-03-14 DIAGNOSIS — D500 Iron deficiency anemia secondary to blood loss (chronic): Secondary | ICD-10-CM

## 2023-03-14 DIAGNOSIS — J4520 Mild intermittent asthma, uncomplicated: Secondary | ICD-10-CM

## 2023-03-14 DIAGNOSIS — N920 Excessive and frequent menstruation with regular cycle: Secondary | ICD-10-CM

## 2023-03-14 DIAGNOSIS — Z8632 Personal history of gestational diabetes: Secondary | ICD-10-CM

## 2023-03-14 MED ORDER — ALBUTEROL SULFATE HFA 108 (90 BASE) MCG/ACT IN AERS: 1.0000 | INHALATION_SPRAY | Freq: Four times a day (QID) | RESPIRATORY_TRACT | 0 refills | Status: AC | PRN

## 2023-03-14 MED ORDER — FERROUS SULFATE 325 (65 FE) MG PO TABS: 325.0000 mg | ORAL_TABLET | Freq: Every day | ORAL | 0 refills | Status: AC

## 2023-03-14 NOTE — Assessment & Plan Note (Signed)
Controlled.  Continue albuterol inhaler as needed. Refill sent to pharmacy.

## 2023-03-14 NOTE — Patient Instructions (Addendum)
Stop by the lab prior to leaving today. I will notify you of your results once received.   You will either be contacted via phone regarding your referral to GYN, or you may receive a letter on your MyChart portal from our referral team with instructions for scheduling an appointment. Please let us know if you have not been contacted by anyone within two weeks.  It was a pleasure to meet you today! Please don't hesitate to contact me with any questions. Welcome to [facility]!

## 2023-03-14 NOTE — Assessment & Plan Note (Signed)
Secondary to menorrhagia.  Referral placed to GYN for further evaluation and removal of Nexplanon.  Refills provided for ferrous sulfate 325 mg tablets to take daily to every other day. Consider iron infusions if warranted.  CBC and iron studies pending.

## 2023-03-14 NOTE — Progress Notes (Signed)
Subjective:    Patient ID: Kathryn Silva, female    DOB: [birth year], 27 y.o.   MRN: [id number]  HPI  Kathryn Silva is a very pleasant 27 y.o. female who presents today who presents today to establish care and discuss the problems mentioned below. Will obtain/review records.  1) Iron Deficiency Anemia: Chronic history with hemoglobin ranging from 9.3-11.7 since 2017 (latest labs date back in chart). In 2017/2018 she had a miscarriage.  Currently managed on ferrous sulfate 325 mg tablets every other day.  She has a history of menorrhagia and prolonged periods. Typical periods last for 2-3 weeks and occur monthly, very heavy for at least 2 weeks, also passes clots. This began when she had her Nexplanon placed in 2022 after the birth of her daughter. Prior to Nexplanon implantation her menstrual cycles lasted for 3 to 4 days and were overall light.  She would like to have her Nexplanon removed.  She does not see GYN.  She ran out of ferrous sulfate pills about 1 month ago.  She has never undergone iron infusions.  She does notice intermittent dizziness, headaches, shortness of breath.  She denies a history of uterine fibroids.  BP Readings from Last 3 Encounters:  03/14/23 128/82  08/27/22 121/75  07/29/21 121/75    2) Asthma: Currently managed on albuterol inhaler as needed. Overall well controlled, last use of albuterol was in high school.  She denies wheezing.  She attributes her shortness of breath to her anemia.   Review of Systems  Constitutional:  Positive for fatigue.  Respiratory:  Positive for shortness of breath.   Cardiovascular:  Negative for chest pain and palpitations.  Gastrointestinal:  Negative for abdominal pain.  Genitourinary:  Positive for menstrual problem.       Menorrhagia  Neurological:  Positive for headaches.         Past Medical History:  Diagnosis Date   Asthma    Carrier of spinal muscular atrophy 01/11/2021   [ ]  partner testing - declined  [ ]   genetic counseling - declined     CHF (congestive heart failure) (HCC)    History of one miscarriage 09/26/2016   08/2016: 11-12wks. No PNC. Came into OB triage VB, pain and BBOW. See surg path for fetus (+abnormalities). Needed D&C for rPOCs. Negative KB, TSH, rpr, hiv, cbc, GC/CT, wet prep, U/a, UDS.    Supervision of other normal pregnancy, antepartum 03/19/2017    Clinic [facility] Prenatal Labs Dating  2nd trimester [place] Blood type: O/Positive/-- (08/20 1354)  Genetic Screen 1 Screen: too late      Quad: declines     Antibody:Negative (08/20 1354) negative Anatomic [place]  Normal - female Rubella: 6.07 (08/20 1354)immune GTT Early:  N/A             Third trimester:  RPR: Non Reactive (08/20 1354)  Flu vaccine  Declined HBsAg: Negative (08/20 1354) negative TDaP vacc    Social History   Socioeconomic History   Marital status: Single    Spouse name: Not on file   Number of children: Not on file   Years of education: Not on file   Highest education level: Not on file  Occupational History   Not on file  Tobacco Use   Smoking status: Never   Smokeless tobacco: Never  Vaping Use   Vaping status: Never Used  Substance and Sexual Activity   Alcohol use: No   Drug use: No   Sexual activity: Yes  Birth control/protection: None  Other Topics Concern   Not on file  Social History Narrative   Not on file   Social Determinants of Health   Financial Resource Strain: Not on file  Food Insecurity: No Food Insecurity (06/06/2021)   Hunger Vital Sign    Worried About Running Out of Food in the Last Year: Never true    Ran Out of Food in the Last Year: Never true  Transportation Needs: No Transportation Needs (06/06/2021)   PRAPARE - Administrator, Civil Service (Medical): No    Lack of Transportation (Non-Medical): No  Physical Activity: Not on file  Stress: Not on file  Social Connections: Not on file  Intimate Partner Violence: Not on file    Past Surgical History:  Procedure  Laterality Date   DILATION AND EVACUATION N/A 09/22/2016   Procedure: SUCTION DILATATION AND EVACUATION;  Surgeon: Natural Steps Bing, MD;  Location: [facility];  Service: Gynecology;  Laterality: N/A;    Family History  Problem Relation Age of Onset   Hypertension Mother     No Known Allergies  Current Outpatient Medications on File Prior to Visit  Medication Sig Dispense Refill   [DISCONTINUED] cetirizine (ZYRTEC ALLERGY) 10 MG tablet Take 1 tablet (10 mg total) by mouth daily. 30 tablet 2   No current facility-administered medications on file prior to visit.    BP 128/82   Pulse 98   Temp 98.1 F (36.7 C) (Temporal)   Ht 4' 11.06" (1.5 m)   Wt 159 lb 3.2 oz (72.2 kg)   SpO2 99%   BMI 32.09 kg/m  Objective:   Physical Exam Cardiovascular:     Rate and Rhythm: Normal rate and regular rhythm.  Pulmonary:     Effort: Pulmonary effort is normal.     Breath sounds: Normal breath sounds.  Musculoskeletal:     Cervical back: Neck supple.  Skin:    General: Skin is warm and dry.  Neurological:     Mental Status: She is alert.  Psychiatric:        Mood and Affect: Mood normal.           Assessment & Plan:  Menorrhagia with regular cycle Assessment & Plan: With iron deficiency anemia history.  Referral placed to GYN for removal of Nexplanon. Resume ferrous sulfate 325 mg daily to every other day.  CBC and iron studies pending.  Orders: -     CBC -     IBC + Ferritin -     Ferrous Sulfate; Take 1 tablet (325 mg total) by mouth daily.  Dispense: 90 tablet; Refill: 0 -     Ambulatory referral to Obstetrics / Gynecology  Iron deficiency anemia due to chronic blood loss Assessment & Plan: Secondary to menorrhagia.  Referral placed to GYN for further evaluation and removal of Nexplanon.  Refills provided for ferrous sulfate 325 mg tablets to take daily to every other day. Consider iron infusions if warranted.  CBC and iron studies pending.  Orders: -     CBC -      IBC + Ferritin -     Ferrous Sulfate; Take 1 tablet (325 mg total) by mouth daily.  Dispense: 90 tablet; Refill: 0 -     Ambulatory referral to Obstetrics / Gynecology -     Comprehensive metabolic panel -     Hemoglobin A1c  Mild intermittent asthma without complication Assessment & Plan: Controlled.  Continue albuterol inhaler as needed. Refill sent to  pharmacy.  Orders: -     Albuterol Sulfate HFA; Inhale 1-2 puffs into the lungs every 6 (six) hours as needed for wheezing or shortness of breath.  Dispense: 8 g; Refill: 0  History of gestational diabetes Assessment & Plan: A1c pending today.         Doreene Nest, NP

## 2023-03-14 NOTE — Assessment & Plan Note (Signed)
A1c pending today. ?

## 2023-03-14 NOTE — Assessment & Plan Note (Signed)
With iron deficiency anemia history.  Referral placed to GYN for removal of Nexplanon. Resume ferrous sulfate 325 mg daily to every other day.  CBC and iron studies pending.

## 2023-03-15 ENCOUNTER — Other Ambulatory Visit (HOSPITAL_BASED_OUTPATIENT_CLINIC_OR_DEPARTMENT_OTHER): Payer: Self-pay

## 2023-03-15 ENCOUNTER — Other Ambulatory Visit: Payer: Self-pay

## 2023-03-15 ENCOUNTER — Encounter (HOSPITAL_BASED_OUTPATIENT_CLINIC_OR_DEPARTMENT_OTHER): Payer: Self-pay | Admitting: Emergency Medicine

## 2023-03-15 ENCOUNTER — Emergency Department (HOSPITAL_BASED_OUTPATIENT_CLINIC_OR_DEPARTMENT_OTHER)
Admission: EM | Admit: 2023-03-15 | Discharge: 2023-03-15 | Disposition: A | Discharge status: Home / Self Care | Payer: Self-pay | Attending: Emergency Medicine | Admitting: Emergency Medicine

## 2023-03-15 DIAGNOSIS — S161XXA Strain of muscle, fascia and tendon at neck level, initial encounter: Secondary | ICD-10-CM | POA: Diagnosis not present

## 2023-03-15 DIAGNOSIS — S39012A Strain of muscle, fascia and tendon of lower back, initial encounter: Secondary | ICD-10-CM | POA: Insufficient documentation

## 2023-03-15 DIAGNOSIS — Y92410 Unspecified street and highway as the place of occurrence of the external cause: Secondary | ICD-10-CM | POA: Insufficient documentation

## 2023-03-15 DIAGNOSIS — S1090XA Unspecified superficial injury of unspecified part of neck, initial encounter: Secondary | ICD-10-CM | POA: Diagnosis present

## 2023-03-15 LAB — COMPREHENSIVE METABOLIC PANEL
ALT: 10 U/L (ref 0–35)
AST: 20 U/L (ref 0–37)
Albumin: 4.4 g/dL (ref 3.5–5.2)
Alkaline Phosphatase: 50 U/L (ref 39–117)
BUN: 14 mg/dL (ref 6–23)
CO2: 25 meq/L (ref 19–32)
Calcium: 9.8 mg/dL (ref 8.4–10.5)
Chloride: 105 meq/L (ref 96–112)
Creatinine, Ser: 0.86 mg/dL (ref 0.40–1.20)
GFR: 93.66 mL/min (ref >60.00)
Glucose, Bld: 92 mg/dL (ref 70–99)
Potassium: 4.4 meq/L (ref 3.5–5.1)
Sodium: 137 meq/L (ref 135–145)
Total Bilirubin: 1 mg/dL (ref 0.2–1.2)
Total Protein: 7.3 g/dL (ref 6.0–8.3)

## 2023-03-15 LAB — IBC + FERRITIN
Ferritin: 6.1 ng/mL — ABNORMAL LOW (ref 10.0–291.0)
Iron: 89 ug/dL (ref 42–145)
Saturation Ratios: 17.7 % — ABNORMAL LOW (ref 20.0–50.0)
TIBC: 502.6 ug/dL — ABNORMAL HIGH (ref 250.0–450.0)
Transferrin: 359 mg/dL (ref 212.0–360.0)

## 2023-03-15 LAB — CBC
HCT: 36.2 % (ref 36.0–46.0)
Hemoglobin: 11.6 g/dL — ABNORMAL LOW (ref 12.0–15.0)
MCHC: 32.1 g/dL (ref 30.0–36.0)
MCV: 86.9 fl (ref 78.0–100.0)
Platelets: 306 10*3/uL (ref 150.0–400.0)
RBC: 4.17 Mil/uL (ref 3.87–5.11)
RDW: 13.2 % (ref 11.5–15.5)
WBC: 8.9 10*3/uL (ref 4.0–10.5)

## 2023-03-15 LAB — HEMOGLOBIN A1C: Hgb A1c MFr Bld: 5.5 % (ref 4.6–6.5)

## 2023-03-15 MED ORDER — METHOCARBAMOL 500 MG PO TABS
500.0000 mg | ORAL_TABLET | Freq: Two times a day (BID) | ORAL | 0 refills | Status: AC
  Filled 2023-03-15: qty 20, 10d supply, fill #0
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
## 2023-03-15 NOTE — ED Notes (Signed)
D/c paperwork reviewed with pt, including prescriptions and follow up care.  All questions and/or concerns addressed at time of d/c.  No further needs expressed. . Pt verbalized understanding, Ambulatory with family to ED exit, NAD.   
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
## 2023-03-16 ENCOUNTER — Other Ambulatory Visit: Payer: Self-pay | Admitting: Primary Care

## 2023-03-16 DIAGNOSIS — D500 Iron deficiency anemia secondary to blood loss (chronic): Secondary | ICD-10-CM

## 2023-03-26 ENCOUNTER — Other Ambulatory Visit (HOSPITAL_BASED_OUTPATIENT_CLINIC_OR_DEPARTMENT_OTHER): Payer: Self-pay

## 2023-03-28 ENCOUNTER — Encounter (HOSPITAL_BASED_OUTPATIENT_CLINIC_OR_DEPARTMENT_OTHER): Payer: Self-pay

## 2023-03-28 ENCOUNTER — Other Ambulatory Visit: Payer: Self-pay

## 2023-03-28 ENCOUNTER — Emergency Department (HOSPITAL_BASED_OUTPATIENT_CLINIC_OR_DEPARTMENT_OTHER)
Admission: EM | Admit: 2023-03-28 | Discharge: 2023-03-28 | Disposition: A | Discharge status: Home / Self Care | Payer: Self-pay | Attending: Emergency Medicine | Admitting: Emergency Medicine

## 2023-03-28 DIAGNOSIS — Z7951 Long term (current) use of inhaled steroids: Secondary | ICD-10-CM | POA: Insufficient documentation

## 2023-03-28 DIAGNOSIS — J45909 Unspecified asthma, uncomplicated: Secondary | ICD-10-CM | POA: Insufficient documentation

## 2023-03-28 DIAGNOSIS — J069 Acute upper respiratory infection, unspecified: Secondary | ICD-10-CM | POA: Insufficient documentation

## 2023-03-28 DIAGNOSIS — U071 COVID-19: Secondary | ICD-10-CM | POA: Insufficient documentation

## 2023-03-28 LAB — RESP PANEL BY RT-PCR (RSV, FLU A&B, COVID)  RVPGX2
Influenza A by PCR: NEGATIVE
Influenza B by PCR: NEGATIVE
Resp Syncytial Virus by PCR: NEGATIVE
SARS Coronavirus 2 by RT PCR: POSITIVE — AB

## 2023-03-28 LAB — GROUP A STREP BY PCR: Group A Strep by PCR: NOT DETECTED

## 2023-03-28 NOTE — Discharge Instructions (Addendum)
You were seen in the emergency department today for nasal congestion, cough.  As we discussed you tested positive for COVID-19.  This is a viral infection that tends to have symptoms similar to the common cold or the flu.   You can take ibuprofen or Tylenol for pain or fever, and I recommend alternating between the 2.  Make sure that you are drinking lots of fluids and getting plenty of rest. You can take decongestants as long as you take them with lots of water. You can use lozenges or chloraseptic spray as needed for sore throat.   Please use Tylenol or ibuprofen for pain.  You may use 600 mg ibuprofen every 6 hours or 1000 mg of Tylenol every 6 hours.  You may choose to alternate between the 2.  This would be most effective.  Do not exceed 4 g of Tylenol within 24 hours.  Do not exceed 3200 mg ibuprofen within 24 hours.  Continue to monitor how you are doing, and return to the emergency department for new or worsening symptoms such as chest pain, difficulty breathing not related to coughing, fever despite medication, or persistent vomiting or diarrhea.  Every work place or school has a different return policy, but I still recommend isolating/quarantining with masking for 5 days. Do not return to work or school until you are fever free for at least 24 hours.   It has been a pleasure taking care of you today and I hope you begin to feel better soon!

## 2023-03-28 NOTE — ED Triage Notes (Signed)
Pt reports to ED with complaints of cough, cold like symptoms, sore throat.

## 2023-03-28 NOTE — ED Provider Notes (Signed)
[place] [facility] Provider Note   CSN: [id number] Arrival date & time: 03/28/23  1526     History  Chief Complaint  Patient presents with   URI    Kathryn Silva is a 27 y.o. female with history of asthma who presents to the ER complaining of cough, flu like symptoms, and sore throat since yesterday. Has not taken anything for her symptoms. Has her albuterol inhaler at home but hasn't felt she has needed to use it, however has felt a little chest tightness.    URI Presenting symptoms: cough and sore throat   Associated symptoms: myalgias        Home Medications Prior to Admission medications   Medication Sig Start Date End Date Taking? Authorizing Provider  albuterol (VENTOLIN HFA) 108 (90 Base) MCG/ACT inhaler Inhale 1-2 puffs into the lungs every 6 (six) hours as needed for wheezing or shortness of breath. 03/14/23   Doreene Nest, NP  ferrous sulfate (FERROUSUL) 325 (65 FE) MG tablet Take 1 tablet (325 mg total) by mouth daily. 03/14/23   Doreene Nest, NP  methocarbamol (ROBAXIN) 500 MG tablet Take 1 tablet (500 mg total) by mouth 2 (two) times daily. 03/15/23   Prosperi, Christian H, PA-C  cetirizine (ZYRTEC ALLERGY) 10 MG tablet Take 1 tablet (10 mg total) by mouth daily. 10/08/19 07/29/20  Merrilee Jansky, MD      Allergies    Patient has no known allergies.    Review of Systems   Review of Systems  Constitutional:  Positive for chills.  HENT:  Positive for sore throat.   Respiratory:  Positive for cough and chest tightness.   Musculoskeletal:  Positive for myalgias.  All other systems reviewed and are negative.   Physical Exam Updated Vital Signs BP 125/78 (BP Location: Right Arm)   Pulse (!) 138   Temp 98.3 F (36.8 C) (Oral)   Resp 18   Ht 4\' 11"  (1.499 m)   Wt 72.6 kg   LMP 03/14/2023   SpO2 100%   BMI 32.32 kg/m  Physical Exam Vitals and nursing note reviewed.  Constitutional:      Appearance:  Normal appearance.  HENT:     Head: Normocephalic and atraumatic.  Eyes:     Conjunctiva/sclera: Conjunctivae normal.  Cardiovascular:     Rate and Rhythm: Normal rate and regular rhythm.  Pulmonary:     Effort: Pulmonary effort is normal. No respiratory distress.     Breath sounds: Normal breath sounds.  Abdominal:     General: There is no distension.     Palpations: Abdomen is soft.     Tenderness: There is no abdominal tenderness.  Skin:    General: Skin is warm and dry.  Neurological:     General: No focal deficit present.     Mental Status: She is alert.     ED Results / Procedures / Treatments   Labs (all labs ordered are listed, but only abnormal results are displayed) Labs Reviewed  RESP PANEL BY RT-PCR (RSV, FLU A&B, COVID)  [id number] - Abnormal; Notable for the following components:      Result Value   SARS Coronavirus 2 by RT PCR POSITIVE (*)    All other components within normal limits  GROUP A STREP BY PCR    EKG None  Radiology No results found.  Procedures Procedures    Medications Ordered in ED Medications - No data to display  ED Course/  Medical Decision Making/ A&P                                 Medical Decision Making  This patient is a 27 y.o. female who presents to the ED for concern of cough, sore throat, body aches.   Differential diagnoses prior to evaluation: The emergent differential diagnosis includes, but is not limited to,  upper respiratory infection, lower respiratory infection, allergies, asthma, irritants, sinus/esophageal foreign body, medications, reflux, interstitial lung disease, postnasal drip, viral illness, sepsis. This is not an exhaustive differential.   Past Medical History / Co-morbidities / Additional history: Chart reviewed. Pertinent results include: asthma  Physical Exam: Physical exam performed. The pertinent findings include: Upon arrival patient was tachycardic to 138, however on my exam patient has normal  heart rate with regular rhythm.  Lung sounds are clear. Afebrile.   Lab Tests/Imaging studies: I personally interpreted labs/imaging and the pertinent results include:  respiratory panel positive for COVID.   Disposition: After consideration of the diagnostic results and the patients response to treatment, I feel that emergency department workup does not suggest an emergent condition requiring admission or immediate intervention beyond what has been performed at this time. Patient with symptoms consistent with COVID viral infection.  Vitals are stable. No signs of dehydration, tolerating PO's.  Lungs are clear.   The plan is: Patient will be discharged with instructions to orally hydrate, rest, and use over-the-counter medications such as anti-inflammatories such as ibuprofen and Tylenol for fever.  The patient is safe for discharge and has been instructed to return immediately for worsening symptoms, change in symptoms or any other concerns.  Final Clinical Impression(s) / ED Diagnoses Final diagnoses:  COVID-19  Viral URI with cough    Rx / DC Orders ED Discharge Orders     None      Portions of this report may have been transcribed using voice recognition software. Every effort was made to ensure accuracy; however, inadvertent computerized transcription errors may be present.    Jeanella Flattery 03/28/23 1735    Charlynne Pander, MD 03/28/23 2225

## 2023-03-29 ENCOUNTER — Other Ambulatory Visit (HOSPITAL_BASED_OUTPATIENT_CLINIC_OR_DEPARTMENT_OTHER): Payer: Self-pay

## 2023-03-29 ENCOUNTER — Telehealth: Payer: Self-pay

## 2023-03-29 NOTE — Transitions of Care (Post Inpatient/ED Visit) (Signed)
   03/29/2023  Name: Kathryn Silva MRN: [id number] DOB: 19-Jun-1996  Today's TOC FU Call Status: Today's TOC FU Call Status:: Successful TOC FU Call Completed TOC FU Call Complete Date: 03/29/23 Patient's Name and Date of Birth confirmed.  Transition Care Management Follow-up Telephone Call Date of Discharge: 03/28/23 Discharge Facility: [facility] Type of Discharge: Emergency Department Reason for ED Visit: Respiratory Respiratory Diagnosis:  (covid) How have you been since you were released from the hospital?: Better Any questions or concerns?: No  Items Reviewed: Did you receive and understand the discharge instructions provided?: Yes Medications obtained,verified, and reconciled?: Yes (Medications Reviewed) Any new allergies since your discharge?: No Dietary orders reviewed?: NA Do you have support at home?: Yes  Medications Reviewed Today: Medications Reviewed Today     Reviewed by Barb Merino, LPN (Licensed Practical Nurse) on 03/29/23 at [phone]  Med List Status: <None>   Medication Order Taking? Sig Documenting Provider Last Dose Status Informant  albuterol (VENTOLIN HFA) 108 (90 Base) MCG/ACT inhaler [id number]  Inhale 1-2 puffs into the lungs every 6 (six) hours as needed for wheezing or shortness of breath. Doreene Nest, NP  Active     Discontinued 07/29/20 1122   ferrous sulfate (FERROUSUL) 325 (65 FE) MG tablet [id number]  Take 1 tablet (325 mg total) by mouth daily. Doreene Nest, NP  Active   methocarbamol (ROBAXIN) 500 MG tablet [id number]  Take 1 tablet (500 mg total) by mouth 2 (two) times daily. Prosperi, Harrel Carina, PA-C  Active             Home Care and Equipment/Supplies: Were Home Health Services Ordered?: NA  Functional Questionnaire: Do you need assistance with bathing/showering or dressing?: No Do you need assistance with meal preparation?: No Do you need assistance with eating?: No Do you have difficulty maintaining  continence: No Do you need assistance with getting out of bed/getting out of a chair/moving?: No Do you have difficulty managing or taking your medications?: No  Follow up appointments reviewed: PCP Follow-up appointment confirmed?: NA (declined follow up appointment at this time) Specialist Hospital Follow-up appointment confirmed?: NA Do you need transportation to your follow-up appointment?: No Do you understand care options if your condition(s) worsen?: Yes-patient verbalized understanding    SIGNATURE Elisha Ponder LPN [facility] AWV Team Direct dial:  [phone]

## 2023-05-16 ENCOUNTER — Other Ambulatory Visit: Payer: Self-pay

## 2023-06-02 ENCOUNTER — Other Ambulatory Visit: Payer: Self-pay

## 2023-06-02 ENCOUNTER — Encounter (HOSPITAL_BASED_OUTPATIENT_CLINIC_OR_DEPARTMENT_OTHER): Payer: Self-pay

## 2023-06-02 ENCOUNTER — Emergency Department (HOSPITAL_BASED_OUTPATIENT_CLINIC_OR_DEPARTMENT_OTHER)
Admission: EM | Admit: 2023-06-02 | Discharge: 2023-06-02 | Disposition: A | Discharge status: Home / Self Care | Payer: Self-pay | Attending: Emergency Medicine | Admitting: Emergency Medicine

## 2023-06-02 DIAGNOSIS — D649 Anemia, unspecified: Secondary | ICD-10-CM | POA: Insufficient documentation

## 2023-06-02 DIAGNOSIS — N926 Irregular menstruation, unspecified: Secondary | ICD-10-CM | POA: Insufficient documentation

## 2023-06-02 DIAGNOSIS — N938 Other specified abnormal uterine and vaginal bleeding: Secondary | ICD-10-CM | POA: Insufficient documentation

## 2023-06-02 LAB — PREGNANCY, URINE: Preg Test, Ur: NEGATIVE

## 2023-06-02 LAB — CBC
HCT: 33 % — ABNORMAL LOW (ref 36.0–46.0)
Hemoglobin: 10.7 g/dL — ABNORMAL LOW (ref 12.0–15.0)
MCH: 27.4 pg (ref 26.0–34.0)
MCHC: 32.4 g/dL (ref 30.0–36.0)
MCV: 84.6 fL (ref 80.0–100.0)
Platelets: 266 10*3/uL (ref 150–400)
RBC: 3.9 MIL/uL (ref 3.87–5.11)
RDW: 12.8 % (ref 11.5–15.5)
WBC: 4.1 10*3/uL (ref 4.0–10.5)
nRBC: 0 % (ref 0.0–0.2)

## 2023-06-02 MED ORDER — ACETAMINOPHEN 500 MG PO TABS
1000.0000 mg | ORAL_TABLET | Freq: Once | ORAL | Status: AC
  Administered 2023-06-02: 1000 mg via ORAL
  Filled 2023-06-02: qty 2

## 2023-06-02 NOTE — Discharge Instructions (Addendum)
It was our pleasure to provide your ER care today - we hope that you feel better.  Your pregnancy test is negative and your blood count appears similar to prior.   Drink plenty of fluids. Take acetaminophen or ibuprofen as need.   Follow up with your doctor/ob-gyn doctor in the next 1-2 weeks.  Return to ER if worse, new symptoms, fevers, new/severe pain, severe abdominal pain, severe bleeding, fainting, or other concern.

## 2023-06-02 NOTE — ED Provider Notes (Signed)
[facility] Provider Note   CSN: [id number] Arrival date & time: 06/02/23  0801     History  Chief Complaint  Patient presents with   Vaginal Bleeding    Kathryn Silva is a 27 y.o. female.  Pt c/o heavy vaginal bleeding in past 1-2 days, similar to some prior periods. Passing some clots. Changing pad q few hours. Has nexplanon for past two years. No recent change in meds or new meds. No anticoag use. Last period was one month ago. No fainting or dizziness. Intermittent cramping c/w prior period cramping. No constant and/or focal abd pain. No nvd. No fever or chills. No other abnormal bruising or bleeding. No fevers. No dysuria. Changes pad every 3-4 hours.   The history is provided by the patient and medical records.  Vaginal Bleeding Associated symptoms: no abdominal pain, no dysuria, no fever and no vaginal discharge        Home Medications Prior to Admission medications   Medication Sig Start Date End Date Taking? Authorizing Provider  albuterol (VENTOLIN HFA) 108 (90 Base) MCG/ACT inhaler Inhale 1-2 puffs into the lungs every 6 (six) hours as needed for wheezing or shortness of breath. 03/14/23   Doreene Nest, NP  ferrous sulfate (FERROUSUL) 325 (65 FE) MG tablet Take 1 tablet (325 mg total) by mouth daily. 03/14/23   Doreene Nest, NP  methocarbamol (ROBAXIN) 500 MG tablet Take 1 tablet (500 mg total) by mouth 2 (two) times daily. 03/15/23   Prosperi, Christian H, PA-C  cetirizine (ZYRTEC ALLERGY) 10 MG tablet Take 1 tablet (10 mg total) by mouth daily. 10/08/19 07/29/20  Merrilee Jansky, MD      Allergies    Patient has no known allergies.    Review of Systems   Review of Systems  Constitutional:  Negative for fever.  HENT:  Negative for nosebleeds.   Respiratory:  Negative for shortness of breath.   Cardiovascular:  Negative for chest pain.  Gastrointestinal:  Negative for abdominal pain, blood in stool, diarrhea  and vomiting.  Genitourinary:  Positive for vaginal bleeding. Negative for dysuria, flank pain, hematuria and vaginal discharge.  Neurological:  Negative for light-headedness.    Physical Exam Updated Vital Signs BP 117/77   Pulse 76   Temp 98.7 F (37.1 C) (Oral)   Resp 16   Ht 1.499 m (4\' 11" )   Wt 72 kg   SpO2 99%   Breastfeeding No   BMI 32.06 kg/m  Physical Exam Vitals and nursing note reviewed.  Constitutional:      Appearance: Normal appearance. She is well-developed.  HENT:     Head: Atraumatic.     Nose: Nose normal.     Mouth/Throat:     Mouth: Mucous membranes are moist.  Eyes:     General: No scleral icterus.    Conjunctiva/sclera: Conjunctivae normal.  Neck:     Trachea: No tracheal deviation.  Cardiovascular:     Rate and Rhythm: Normal rate.     Pulses: Normal pulses.  Pulmonary:     Effort: Pulmonary effort is normal. No respiratory distress.  Abdominal:     General: Bowel sounds are normal. There is no distension.     Palpations: Abdomen is soft. There is no mass.     Tenderness: There is no abdominal tenderness. There is no guarding.  Genitourinary:    Comments: Chaperoned pelvic exam with rn. Small amount blood in vagina. No active or brisk  bleeding. No trauma/lac. No cmt. No adx masses/tenderness.  Musculoskeletal:        General: No swelling.     Cervical back: Neck supple. No muscular tenderness.  Skin:    General: Skin is warm and dry.     Findings: No rash.  Neurological:     Mental Status: She is alert.     Comments: Alert, speech normal.   Psychiatric:        Mood and Affect: Mood normal.     ED Results / Procedures / Treatments   Labs (all labs ordered are listed, but only abnormal results are displayed) Results for orders placed or performed during the hospital encounter of 06/02/23  CBC  Result Value Ref Range   WBC 4.1 4.0 - 10.5 K/uL   RBC 3.90 3.87 - 5.11 MIL/uL   Hemoglobin 10.7 (L) 12.0 - 15.0 g/dL   HCT 83.1 (L) 51.7  - 46.0 %   MCV 84.6 80.0 - 100.0 fL   MCH 27.4 26.0 - 34.0 pg   MCHC 32.4 30.0 - 36.0 g/dL   RDW 61.6 07.3 - 71.0 %   Platelets 266 150 - 400 K/uL   nRBC 0.0 0.0 - 0.2 %  Pregnancy, urine  Result Value Ref Range   Preg Test, Ur NEGATIVE NEGATIVE    EKG None  Radiology No results found.  Procedures Procedures    Medications Ordered in ED Medications - No data to display  ED Course/ Medical Decision Making/ A&P                                 Medical Decision Making Problems Addressed: Anemia, mild: chronic illness or injury Dysfunctional uterine bleeding: acute illness or injury with systemic symptoms Irregular menstrual bleeding: acute illness or injury with systemic symptoms  Amount and/or Complexity of Data Reviewed External Data Reviewed: notes. Labs: ordered.  Risk OTC drugs. Decision regarding hospitalization.   Labs ordered/sent.   Differential diagnosis includes dysfunctional uterine bleeding, preg/ectopic, severe anemia, etc. Dispo decision including potential need for admission considered - will get labs and reassess.   Reviewed nursing notes and prior charts for additional history. External reports reviewed.   Labs reviewed/interpreted by me - hgb 10.7 c/w prior. Preg neg.   Abd soft nt. Vitals normal. Pt appears stable for d/c. Pt indicates has ob/gyn doc with whom she can f/u.   Rec close outpatient f/u.   Return precautions provided.          Final Clinical Impression(s) / ED Diagnoses Final diagnoses:  None    Rx / DC Orders ED Discharge Orders     None         Cathren Laine, MD 06/02/23 [phone]

## 2023-06-02 NOTE — ED Triage Notes (Signed)
The patient is having heavy vaginal bleeding since yesterday. She is having abd pain. Denied pregnancy. She has an implant for her birth control.

## 2023-06-07 ENCOUNTER — Telehealth: Payer: Self-pay

## 2023-06-07 NOTE — Transitions of Care (Post Inpatient/ED Visit) (Signed)
Unable to reach patient by phone and left v/m requesting call back at [phone].       06/07/2023  Name: Kathryn Silva MRN: [id number] DOB: Mar 14, 1996  Today's TOC FU Call Status: Today's TOC FU Call Status:: Unsuccessful Call (1st Attempt) Unsuccessful Call (1st Attempt) Date: 06/07/23  Attempted to reach the patient regarding the most recent Inpatient/ED visit.  Follow Up Plan: Additional outreach attempts will be made to reach the patient to complete the Transitions of Care (Post Inpatient/ED visit) call.   Signature  Lewanda Rife, LPN
